# Patient Record
Sex: Male | Born: 1935 | Race: White | Marital: Married | Smoking: Former smoker
Health system: Southern US, Community
[De-identification: ages and names within clinical notes are randomized; demographics above are authoritative.]

## PROBLEM LIST (undated history)

## (undated) DIAGNOSIS — E039 Hypothyroidism, unspecified: Secondary | ICD-10-CM

## (undated) DIAGNOSIS — R972 Elevated prostate specific antigen [PSA]: Secondary | ICD-10-CM

## (undated) DIAGNOSIS — J189 Pneumonia, unspecified organism: Secondary | ICD-10-CM

## (undated) DIAGNOSIS — R430 Anosmia: Secondary | ICD-10-CM

## (undated) DIAGNOSIS — G319 Degenerative disease of nervous system, unspecified: Secondary | ICD-10-CM

## (undated) DIAGNOSIS — G629 Polyneuropathy, unspecified: Secondary | ICD-10-CM

## (undated) DIAGNOSIS — N529 Male erectile dysfunction, unspecified: Secondary | ICD-10-CM

## (undated) DIAGNOSIS — M1990 Unspecified osteoarthritis, unspecified site: Secondary | ICD-10-CM

## (undated) DIAGNOSIS — R270 Ataxia, unspecified: Secondary | ICD-10-CM

## (undated) DIAGNOSIS — H548 Legal blindness, as defined in USA: Secondary | ICD-10-CM

## (undated) DIAGNOSIS — K635 Polyp of colon: Secondary | ICD-10-CM

## (undated) DIAGNOSIS — I10 Essential (primary) hypertension: Secondary | ICD-10-CM

## (undated) DIAGNOSIS — S42309A Unspecified fracture of shaft of humerus, unspecified arm, initial encounter for closed fracture: Secondary | ICD-10-CM

## (undated) HISTORY — PX: LUMBAR LAMINECTOMY: SHX95

## (undated) HISTORY — PX: KNEE ARTHROSCOPY: SUR90

## (undated) HISTORY — PX: TONSILLECTOMY: SUR1361

## (undated) HISTORY — PX: APPENDECTOMY: SHX54

---

## 1958-08-07 DIAGNOSIS — J189 Pneumonia, unspecified organism: Secondary | ICD-10-CM

## 1958-08-07 HISTORY — DX: Pneumonia, unspecified organism: J18.9

## 1977-08-07 HISTORY — PX: OTHER SURGICAL HISTORY: SHX169

## 1997-08-07 HISTORY — PX: OTHER SURGICAL HISTORY: SHX169

## 2001-08-07 HISTORY — PX: EYE SURGERY: SHX253

## 2010-04-19 ENCOUNTER — Ambulatory Visit: Payer: Self-pay | Admitting: Gastroenterology

## 2010-04-19 HISTORY — PX: CATARACT EXTRACTION: SUR2

## 2011-04-14 ENCOUNTER — Encounter (INDEPENDENT_AMBULATORY_CARE_PROVIDER_SITE_OTHER): Payer: Medicare Other | Admitting: Ophthalmology

## 2011-04-14 DIAGNOSIS — H43819 Vitreous degeneration, unspecified eye: Secondary | ICD-10-CM

## 2011-04-14 DIAGNOSIS — H47219 Primary optic atrophy, unspecified eye: Secondary | ICD-10-CM

## 2011-04-14 DIAGNOSIS — H348192 Central retinal vein occlusion, unspecified eye, stable: Secondary | ICD-10-CM

## 2011-04-14 DIAGNOSIS — H3530 Unspecified macular degeneration: Secondary | ICD-10-CM

## 2011-07-20 ENCOUNTER — Ambulatory Visit: Payer: Self-pay

## 2012-04-17 ENCOUNTER — Encounter (INDEPENDENT_AMBULATORY_CARE_PROVIDER_SITE_OTHER): Payer: 59 | Admitting: Ophthalmology

## 2013-11-22 ENCOUNTER — Ambulatory Visit: Payer: Self-pay | Admitting: Internal Medicine

## 2014-01-17 ENCOUNTER — Emergency Department: Payer: Self-pay | Admitting: Emergency Medicine

## 2014-01-17 LAB — BASIC METABOLIC PANEL
Anion Gap: 9 (ref 7–16)
BUN: 14 mg/dL (ref 7–18)
CALCIUM: 8.9 mg/dL (ref 8.5–10.1)
CREATININE: 0.83 mg/dL (ref 0.60–1.30)
Chloride: 106 mmol/L (ref 98–107)
Co2: 25 mmol/L (ref 21–32)
EGFR (African American): >60
EGFR (Non-African Amer.): >60
GLUCOSE: 100 mg/dL — AB (ref 65–99)
Osmolality: 280 (ref 275–301)
Potassium: 4.1 mmol/L (ref 3.5–5.1)
SODIUM: 140 mmol/L (ref 136–145)

## 2014-01-17 LAB — CBC
HCT: 45.9 % (ref 40.0–52.0)
HGB: 15 g/dL (ref 13.0–18.0)
MCH: 29.5 pg (ref 26.0–34.0)
MCHC: 32.6 g/dL (ref 32.0–36.0)
MCV: 90 fL (ref 80–100)
Platelet: 211 10*3/uL (ref 150–440)
RBC: 5.08 10*6/uL (ref 4.40–5.90)
RDW: 14.6 % — ABNORMAL HIGH (ref 11.5–14.5)
WBC: 8.3 10*3/uL (ref 3.8–10.6)

## 2014-05-03 DIAGNOSIS — R430 Anosmia: Secondary | ICD-10-CM | POA: Insufficient documentation

## 2014-05-20 DIAGNOSIS — N529 Male erectile dysfunction, unspecified: Secondary | ICD-10-CM | POA: Insufficient documentation

## 2014-11-19 DIAGNOSIS — Z0000 Encounter for general adult medical examination without abnormal findings: Secondary | ICD-10-CM | POA: Insufficient documentation

## 2015-05-19 DIAGNOSIS — R972 Elevated prostate specific antigen [PSA]: Secondary | ICD-10-CM | POA: Insufficient documentation

## 2017-01-03 ENCOUNTER — Encounter: Payer: Self-pay | Admitting: *Deleted

## 2017-01-04 ENCOUNTER — Ambulatory Visit
Admission: RE | Admit: 2017-01-04 | Discharge: 2017-01-04 | Disposition: A | Discharge status: Home / Self Care | Payer: Medicare Other | Source: Ambulatory Visit | Attending: Gastroenterology | Admitting: Gastroenterology

## 2017-01-04 ENCOUNTER — Encounter
Admission: RE | Disposition: A | Discharge status: Home / Self Care | Payer: Self-pay | Source: Ambulatory Visit | Attending: Gastroenterology

## 2017-01-04 ENCOUNTER — Ambulatory Visit: Payer: Medicare Other | Admitting: Anesthesiology

## 2017-01-04 ENCOUNTER — Encounter: Payer: Self-pay | Admitting: *Deleted

## 2017-01-04 DIAGNOSIS — N528 Other male erectile dysfunction: Secondary | ICD-10-CM | POA: Insufficient documentation

## 2017-01-04 DIAGNOSIS — Z79899 Other long term (current) drug therapy: Secondary | ICD-10-CM | POA: Diagnosis not present

## 2017-01-04 DIAGNOSIS — Q438 Other specified congenital malformations of intestine: Secondary | ICD-10-CM | POA: Insufficient documentation

## 2017-01-04 DIAGNOSIS — Z86010 Personal history of colonic polyps: Secondary | ICD-10-CM | POA: Diagnosis not present

## 2017-01-04 DIAGNOSIS — Z7982 Long term (current) use of aspirin: Secondary | ICD-10-CM | POA: Insufficient documentation

## 2017-01-04 DIAGNOSIS — N400 Enlarged prostate without lower urinary tract symptoms: Secondary | ICD-10-CM | POA: Diagnosis not present

## 2017-01-04 DIAGNOSIS — E039 Hypothyroidism, unspecified: Secondary | ICD-10-CM | POA: Diagnosis not present

## 2017-01-04 DIAGNOSIS — K640 First degree hemorrhoids: Secondary | ICD-10-CM | POA: Insufficient documentation

## 2017-01-04 DIAGNOSIS — K5730 Diverticulosis of large intestine without perforation or abscess without bleeding: Secondary | ICD-10-CM | POA: Diagnosis not present

## 2017-01-04 DIAGNOSIS — I10 Essential (primary) hypertension: Secondary | ICD-10-CM | POA: Insufficient documentation

## 2017-01-04 DIAGNOSIS — D125 Benign neoplasm of sigmoid colon: Secondary | ICD-10-CM | POA: Diagnosis not present

## 2017-01-04 DIAGNOSIS — Z1211 Encounter for screening for malignant neoplasm of colon: Secondary | ICD-10-CM | POA: Diagnosis present

## 2017-01-04 HISTORY — DX: Elevated prostate specific antigen (PSA): R97.20

## 2017-01-04 HISTORY — DX: Hypothyroidism, unspecified: E03.9

## 2017-01-04 HISTORY — DX: Essential (primary) hypertension: I10

## 2017-01-04 HISTORY — DX: Polyneuropathy, unspecified: G62.9

## 2017-01-04 HISTORY — DX: Anosmia: R43.0

## 2017-01-04 HISTORY — DX: Ataxia, unspecified: R27.0

## 2017-01-04 HISTORY — PX: COLONOSCOPY WITH PROPOFOL: SHX5780

## 2017-01-04 HISTORY — DX: Unspecified osteoarthritis, unspecified site: M19.90

## 2017-01-04 HISTORY — DX: Male erectile dysfunction, unspecified: N52.9

## 2017-01-04 HISTORY — DX: Polyp of colon: K63.5

## 2017-01-04 HISTORY — DX: Pneumonia, unspecified organism: J18.9

## 2017-01-04 SURGERY — COLONOSCOPY WITH PROPOFOL
Anesthesia: General

## 2017-01-04 MED ORDER — PROPOFOL 10 MG/ML IV BOLUS
INTRAVENOUS | Status: DC | PRN
  Administered 2017-01-04: 40 mg via INTRAVENOUS

## 2017-01-04 MED ORDER — SODIUM CHLORIDE 0.9 % IV SOLN
INTRAVENOUS | Status: DC
  Administered 2017-01-04: 11:00:00 via INTRAVENOUS

## 2017-01-04 MED ORDER — PROPOFOL 500 MG/50ML IV EMUL
INTRAVENOUS | Status: AC
  Filled 2017-01-04: qty 50

## 2017-01-04 MED ORDER — PHENYLEPHRINE HCL 10 MG/ML IJ SOLN
INTRAMUSCULAR | Status: AC
  Filled 2017-01-04: qty 1

## 2017-01-04 MED ORDER — PHENYLEPHRINE HCL 10 MG/ML IJ SOLN
INTRAMUSCULAR | Status: DC | PRN
  Administered 2017-01-04 (×3): 100 ug via INTRAVENOUS

## 2017-01-04 MED ORDER — PROPOFOL 500 MG/50ML IV EMUL
INTRAVENOUS | Status: DC | PRN
  Administered 2017-01-04: 120 ug/kg/min via INTRAVENOUS

## 2017-01-04 NOTE — Op Note (Addendum)
[facility] Gastroenterology Patient Name: Robert Knight Procedure Date: 01/04/2017 12:34 PM MRN: [id number] Account #: [id number] Date of Birth: 04-28-1935 Admit Type: Outpatient Age: 82 Room: [facility] ENDO ROOM 1 Gender: Male Note Status: Finalized Procedure:            Colonoscopy Indications:          Personal history of colonic polyps Providers:            Lollie Sails, MD Referring MD:         Ocie Cornfield. Ouida Sills MD, MD (Referring MD) Medicines:            Monitored Anesthesia Care Complications:        No immediate complications. Procedure:            Pre-Anesthesia Assessment:                       - ASA Grade Assessment: II - A patient with mild                        systemic disease.                       After obtaining informed consent, the colonoscope was                        passed under direct vision. Throughout the procedure,                        the patient's blood pressure, pulse, and oxygen                        saturations were monitored continuously. The                        Colonoscope was introduced through the anus and                        advanced to the the cecum, identified by appendiceal                        orifice and ileocecal valve. The colonoscopy was                        performed with moderate difficulty due to poor bowel                        prep and a tortuous colon. Successful completion of the                        procedure was aided by lavage. The quality of the bowel                        preparation was fair. Findings:      Multiple medium-mouthed diverticula were found in the sigmoid colon,       descending colon, transverse colon and ascending colon.      Two flat polyps were found in the mid sigmoid colon. The polyps were 3       to 5 mm in size. These polyps were removed with a cold snare. Resection       and  retrieval were complete.      Non-bleeding internal hemorrhoids were found during  retroflexion and       during anoscopy. The hemorrhoids were small and Grade I (internal       hemorrhoids that do not prolapse).      No additional abnormalities were found on retroflexion.      The digital rectal exam findings include enlarged prostate. Impression:           - Preparation of the colon was fair.                       - Diverticulosis in the sigmoid colon, in the                        descending colon, in the transverse colon and in the                        ascending colon.                       - Two 3 to 5 mm polyps in the mid sigmoid colon,                        removed with a cold snare. Resected and retrieved.                       - Non-bleeding internal hemorrhoids.                       - Enlarged prostate found on digital rectal exam. Recommendation:       - Discharge patient to home.                       - Low residue diet today, then advance as tolerated to                        advance diet as tolerated. Procedure Code(s):    --- Professional ---                       [id number], Colonoscopy, flexible; with removal of tumor(s),                        polyp(s), or other lesion(s) by snare technique Diagnosis Code(s):    --- Professional ---                       K64.0, First degree hemorrhoids                       D12.5, Benign neoplasm of sigmoid colon                       Z86.010, Personal history of colonic polyps                       K57.30, Diverticulosis of large intestine without                        perforation or abscess without bleeding  N40.0, Benign prostatic hyperplasia without lower                        urinary tract symptoms CPT copyright 2016 American Medical Association. All rights reserved. The codes documented in this report are preliminary and upon coder review may  be revised to meet current compliance requirements. Lollie Sails, MD 01/04/2017 1:22:30 PM This report has been signed  electronically. Number of Addenda: 0 Note Initiated On: 01/04/2017 12:34 PM Scope Withdrawal Time: 0 hours 13 minutes 17 seconds  Total Procedure Duration: 0 hours 32 minutes 57 seconds       [facility]

## 2017-01-04 NOTE — H&P (Signed)
Outpatient short stay form Pre-procedure 01/04/2017 2:10 PM Lollie Sails MD  Primary Physician: Dr. Frazier Richards  Reason for visit:  Colonoscopy  History of present illness:  Patient is a 82 year old male presenting today as above. Has a personal history of adenomatous colon polyps. He tolerated his prep well. He takes no blood thinning agents he does take 81 mg aspirin but has held that for several days.    Current Facility-Administered Medications:  .  0.9 %  sodium chloride infusion, , Intravenous, Continuous, Lollie Sails, MD, Last Rate: 20 mL/hr at 01/04/17 1116  Prescriptions Prior to Admission  Medication Sig Dispense Refill Last Dose  . aspirin EC 81 MG tablet Take 81 mg by mouth daily.   Past Week at Unknown time  . Cholecalciferol 2000 units CAPS Take 1 capsule by mouth daily.   Past Week at Unknown time  . fluticasone (FLONASE) 50 MCG/ACT nasal spray Place 2 sprays into both nostrils daily.   Past Week at Unknown time  . levothyroxine (SYNTHROID, LEVOTHROID) 75 MCG tablet Take 75 mcg by mouth daily before breakfast. Daily on an empty stomach with a glass of water at least 30 to 60 minutes before meal   Past Week at Unknown time  . lisinopril (PRINIVIL,ZESTRIL) 10 MG tablet Take 10 mg by mouth daily.   01/03/2017 at Unknown time  . sildenafil (REVATIO) 20 MG tablet Take 20 mg by mouth daily as needed. Take 1-2 tablets by mouth once daily as needed   Not Taking at Unknown time     No Known Allergies   Past Medical History:  Diagnosis Date  . Anosmia   . Ataxia   . Colon polyp   . ED (erectile dysfunction)    of organic origin  . Elevated PSA   . Hypertension   . Hypothyroidism   . Neuropathy    peripheral neuropathy  . Osteoarthritis   . Pneumonia 1960    Review of systems:      Physical Exam    Heart and lungs: Regular rate and rhythm without rub or gallop, lungs are bilaterally clear    HEENT: Normocephalic atraumatic eyes are anicteric    Other:     Pertinant exam for procedure: Soft nontender nondistended bowel sounds positive normoactive.    Planned proceedures: Colonoscopy and indicated procedures. I have discussed the risks benefits and complications of procedures to include not limited to bleeding, infection, perforation and the risk of sedation and the patient wishes to proceed.    Lollie Sails, MD Gastroenterology 01/04/2017  2:10 PM

## 2017-01-04 NOTE — Anesthesia Postprocedure Evaluation (Signed)
Anesthesia Post Note  Patient: Robert Knight  Procedure(s) Performed: Procedure(s) (LRB): COLONOSCOPY WITH PROPOFOL (N/A)  Patient location during evaluation: Endoscopy Anesthesia Type: General Level of consciousness: awake and alert Pain management: pain level controlled Vital Signs Assessment: post-procedure vital signs reviewed and stable Respiratory status: spontaneous breathing and respiratory function stable Cardiovascular status: stable Anesthetic complications: no     Last Vitals:  Vitals:   01/04/17 1344 01/04/17 1354  BP: 114/70 137/70  Pulse: (!) 59 65  Resp: 15 12  Temp:      Last Pain:  Vitals:   01/04/17 1102  TempSrc: Tympanic                 [name],[name]

## 2017-01-04 NOTE — Anesthesia Preprocedure Evaluation (Signed)
Anesthesia Evaluation  Patient identified by MRN, date of birth, ID band  Reviewed: Allergy & Precautions, NPO status , Patient's Chart, lab work & pertinent test results  History of Anesthesia Complications (+) history of anesthetic complications  Airway Mallampati: II       Dental   Pulmonary neg pulmonary ROS,           Cardiovascular hypertension, Pt. on medications      Neuro/Psych negative neurological ROS     GI/Hepatic negative GI ROS, Neg liver ROS,   Endo/Other  Hypothyroidism   Renal/GU negative Renal ROS     Musculoskeletal   Abdominal   Peds  Hematology negative hematology ROS (+)   Anesthesia Other Findings   Reproductive/Obstetrics                             Anesthesia Physical Anesthesia Plan  ASA: II  Anesthesia Plan: General   Post-op Pain Management:    Induction: Intravenous  Airway Management Planned:   Additional Equipment:   Intra-op Plan:   Post-operative Plan:   Informed Consent: I have reviewed the patients History and Physical, chart, labs and discussed the procedure including the risks, benefits and alternatives for the proposed anesthesia with the patient or authorized representative who has indicated his/her understanding and acceptance.     Plan Discussed with:   Anesthesia Plan Comments:         Anesthesia Quick Evaluation

## 2017-01-04 NOTE — Transfer of Care (Signed)
Immediate Anesthesia Transfer of Care Note  Patient: Robert Knight  Procedure(s) Performed: Procedure(s): COLONOSCOPY WITH PROPOFOL (N/A)  Patient Location: PACU  Anesthesia Type:General  Level of Consciousness: sedated  Airway & Oxygen Therapy: Patient Spontanous Breathing and Patient connected to nasal cannula oxygen  Post-op Assessment: Report given to RN and Post -op Vital signs reviewed and stable  Post vital signs: Reviewed and stable  Last Vitals:  Vitals:   01/04/17 1102 01/04/17 1324  BP: (!) 129/91 95/56  Pulse: 68 60  Resp: 16 14  Temp: 36.4 C     Last Pain:  Vitals:   01/04/17 1102  TempSrc: Tympanic         Complications: No apparent anesthesia complications

## 2017-01-04 NOTE — Anesthesia Post-op Follow-up Note (Cosign Needed)
Anesthesia QCDR form completed.        

## 2017-01-05 ENCOUNTER — Encounter: Payer: Self-pay | Admitting: Gastroenterology

## 2017-01-05 LAB — SURGICAL PATHOLOGY

## 2017-03-26 DIAGNOSIS — M545 Low back pain, unspecified: Secondary | ICD-10-CM | POA: Insufficient documentation

## 2017-04-04 ENCOUNTER — Encounter: Payer: Self-pay | Admitting: Physical Therapy

## 2017-04-04 ENCOUNTER — Ambulatory Visit: Payer: Medicare Other | Attending: Orthopedic Surgery | Admitting: Physical Therapy

## 2017-04-04 DIAGNOSIS — M5442 Lumbago with sciatica, left side: Secondary | ICD-10-CM | POA: Diagnosis present

## 2017-04-04 DIAGNOSIS — M79605 Pain in left leg: Secondary | ICD-10-CM | POA: Diagnosis present

## 2017-04-04 NOTE — Therapy (Signed)
[place] MAIN [facility] SERVICES [address] [place], [place], [address] Phone: [phone]   Fax:  [phone]  Physical Therapy Evaluation  Patient Details  Name: Robert Knight MRN: [id number] Date of Birth: Jul 25, 1935 Referring Provider: Carlynn Spry, PA  Encounter Date: 04/04/2017      PT End of Session - 04/04/17 1124    Visit Number 1   Number of Visits 17   Date for PT Re-Evaluation 05/30/17   Authorization Type G-code 1   Authorization Time Period 10   PT Start Time 0845   PT Stop Time 0945   PT Time Calculation (min) 60 min   Activity Tolerance Patient tolerated treatment well   Behavior During Therapy [facility] for tasks assessed/performed      Past Medical History:  Diagnosis Date  . Anosmia   . Ataxia   . Colon polyp   . ED (erectile dysfunction)    of organic origin  . Elevated PSA   . Hypertension   . Hypothyroidism   . Neuropathy    peripheral neuropathy  . Osteoarthritis   . Pneumonia 1960    Past Surgical History:  Procedure Laterality Date  . APPENDECTOMY    . CATARACT EXTRACTION  04/19/2010  . COLONOSCOPY WITH PROPOFOL N/A 01/04/2017   Procedure: COLONOSCOPY WITH PROPOFOL;  Surgeon: Lollie Sails, MD;  Location: [facility] ENDOSCOPY;  Service: Endoscopy;  Laterality: N/A;  . EYE SURGERY Left 2003   vitrectomy with TPA injection  . KNEE ARTHROSCOPY Bilateral 1973, 2000, 2002   1973 left, 2000 right, 2002 right  . left hip pin Left 1999  . rectal cauterization  1979  . TONSILLECTOMY      There were no vitals filed for this visit.       Subjective Assessment - 04/04/17 0844    Subjective Pt reports he is doing a little better today than yesterday. Pt reports that his LLE will buckle intermittently with intermittent pain radiating down LLE and nearly constant mild pain in the L gluteal region. Pt has been doing some exercise on a semi-recumbent bike that he has been unable to do recently. Pt reports that  he recently started keeping his wallet in his front pocket due pain, but he previously kept wallet in his L back pocket for the past 60+ years.     Pertinent History 82 y/o male has been having radiating L sided LBP for about a week. Pt has never had pain like this before. He reports his LLE is buckling occasionally with pain down the LLE to the foot at times. Pt has been using a semi-recumbent bike for years and has had no pain with this until recently. Pt has no pain in sitting and increased pain in standing and with walking.    Limitations Lifting;Standing;Walking;House hold activities   How long can you sit comfortably? Unlimited    How long can you stand comfortably? Increased pain with initial standing and with weight shif to the LLE   How long can you walk comfortably? It varries, but limited   Diagnostic tests X-ray or lumbar spine and pelvis, no report available at this time. Patient reports that he thinks his doctor mentioned something about possible arthritis.    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   Currently in Pain? Yes   Pain Score 2    Pain Location Back  Pain Orientation Left   Pain Descriptors / Indicators Sharp;Dull  Inconsistant   Pain Type Acute pain   Pain Radiating Towards L foot   Pain Onset In the past 7 days   Pain Frequency Intermittent   Aggravating Factors  Standing, walking   Pain Relieving Factors Sitting   Effect of Pain on Daily Activities Decreased standing activity            [facility] PT Assessment - 04/04/17 0001      Assessment   Medical Diagnosis Low back pain   Referring Provider Carlynn Spry, PA   Onset Date/Surgical Date 04/11/17   Hand Dominance Right   Next MD Visit None scheduled   Prior Therapy Yes, 15 yeas ago for knees; therapy was successful     Precautions   Precautions None     Restrictions   Weight Bearing Restrictions No      Balance Screen   Has the patient fallen in the past 6 months No   Has the patient had a decrease in activity level because of a fear of falling?  No   Is the patient reluctant to leave their home because of a fear of falling?  No     Home Environment   Living Environment Private residence   Living Arrangements Spouse/significant other   Type of [place] to enter   Entrance Stairs-Number of Steps 1   Entrance Stairs-Rails None   Home Layout One level   Home Equipment None     Prior Function   Level of Independence Independent   Vocation Retired  1998   Leisure Pt spends many hours sitting to [employer]; he has been unable to keep up with this over the past week     Cognition   Overall Cognitive Status Within Functional Limits for tasks assessed         PROM/AROM:  Upper Extremity: NT  Lower extremity: Hypomobility in hip flexors, hamstrings; hips are generally hypomobile bilaterally  Lumbar  Flexion 40    Ext 6    Lateral flexion 10 degrees bilaterally   Observations:  Pt had forward head posture, rounded shoulders, and increased thoracic kyphosis.   STRENGTH:  Graded on a 0-5 scale Muscle Group Left Right  Gross Upper Extremity    Hip Flex 4+ 4+  Hip Abd 5 5  Hip Add 5 5          Knee Flex 5 5  Knee Ext 4+ 4+  Ankle DF 4 5       Sensory:  Light touch: No gross deficits with light touch; pt reports some decreased sensation at night in left ankle that is alleviated with taking sock off.   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    Coordination:   No impairments with gross assessment.       BALANCE:  Static Standing Balance  Normal Able to maintain standing balance against maximal resistance X  Good Able to maintain standing balance against moderate resistance   Good-/Fair+ Able to maintain standing balance against minimal resistance   Fair Able to stand unsupported without UE  support and without LOB for 1-2 min   Fair- Requires Min A and UE support to maintain standing without loss of balance   Poor+ Requires mod A and UE support to maintain standing without loss of balance   Poor Requires max A and UE support to  maintain standing balance without loss     Standing Dynamic Balance  Normal Stand independently unsupported, able to weight shift and cross midline maximally   Good Stand independently unsupported, able to weight shift and cross midline moderately X  Good-/Fair+ Stand independently unsupported, able to weight shift across midline minimally   Fair Stand independently unsupported, weight shift, and reach ipsilaterally, loss of balance when crossing midline   Poor+ Able to stand with Min A and reach ipsilaterally, unable to weight shift   Poor+ Able to stand with Mod A and minimally reach ipsilaterally, unable to cross midline.        SPECIAL TESTS:   FADIR (-) bilaterally   FABER (+) L with hip pain, tightness only on R  SLR (+) bilaterally with pain at below 45 degrees in gluteal region; L sided gluteal pain reproduced with ankle DF  Contralateral SLR (+) for pain in L low back with RLE leg raise  SLS (+) L with pain in gluteal region   Palpation of greater trochanter (-)   Figure Four stretch increased gluteal pain on L at end range  Slump proximal to distal (-)  Slump distal to proximal (-)       FUNCTIONAL MOBILITY:   Transfers: Pt is able to transfer sit<>stand without use of hands and no evidence of LOB.   Gait: Pt ambulates with slightly flexed posture. He is aware of this and reports it is antalgic due to pain with extension. Pt has reciprocal gait with decreased stride length when walking at preferred pace. No other gross abnormalities.     Objective measurements completed on examination: See above findings.        Treatment:  Ther-ex:   Supine:  Piriformis stretch in figure four position 2 x 30 sec with cues to avoid  painful ROM; pt reports minor discomfort with gentle stretch.  Sitting:  Hamstring stretch 2 x 30 sec with cues to hold for extended time and to maintain knee straight.   The above stretched were added to pt's HEP with instructions to perform each as above several times per day unless symptoms are worsened with stretching.           PT Education - 04/04/17 1124    Education provided Yes   Education Details plan of care, ther-ex, HEP initiated   Person(s) Educated Patient   Methods Explanation;Demonstration;Tactile cues;Verbal cues   Comprehension Verbalized understanding;Returned demonstration;Verbal cues required;Tactile cues required          PT Short Term Goals - 04/04/17 1239      PT SHORT TERM GOAL #1   Title Pt will perform HEP 3/7 days during first 4 weeks of therapy to maximize his improvement in functional mobility and safety   Baseline New HEP   Time 4   Period Weeks   Status New   Target Date 05/02/17     PT SHORT TERM GOAL #2   Title Hip flexor and hamstring AROM will improve to [facility] to enable improved posture in gait for gait efficiency.   Baseline Mildly flexed posture, limited in FABER and SLR position    Time 4   Period Weeks   Status New   Target Date 05/02/17           PT Long Term Goals - 04/04/17 1235      PT LONG TERM GOAL #1   Title Patient will report a worst pain of 3/10 on VAS in back and LEs to improve tolerance  with ADLs and reduced symptoms with activities   Baseline 8/10 at worst   Time 8   Period Weeks   Status New   Target Date 05/30/17     PT LONG TERM GOAL #2   Title Patient will tolerate standing unsupported demonstrating erect posture with minimal thoracic kyphosis for 30+ minutes with maximum of 3/10 back pain to demonstrate improved positional tolerance for standing activities and ADLs.    Baseline Immediate pain with standing, variable progression   Time 8   Period Weeks   Status New   Target Date 05/30/17     PT  LONG TERM GOAL #3   Title  Patient will demonstrate an increase in lumbar spine AROM by 10 deg in all directions to enable performance of ADLs such as overhead reaching, and lifting without limitation.    Baseline Limited in all directions: lumbar flexion 40 deg, ext 6 deg, lateral flexion 10 deg   Time 8   Period Weeks   Status New   Target Date 05/30/17     PT LONG TERM GOAL #4   Title Patient will increase lower extremity functional scale to >60/80 to demonstrate improved functional mobility and increased tolerance with ADLs.    Baseline Will assess next visit   Time 8   Period Weeks   Status New   Target Date 05/30/17     PT LONG TERM GOAL #5   Title Patient will reduce modified Oswestry score to <20 as to demonstrate minimal disability with ADLs including improved sleeping tolerance, walking/sitting tolerance etc for better mobility with ADLs.    Baseline Will assess next visit   Time 8   Period Weeks   Status New   Target Date 05/30/17                Plan - 04/04/17 1126    Clinical Impression Statement 82 y/o male presents to therapy with a new onset of left sided pain in the gluteal region that radiates down his LLE intermittently. His pain is worse in standing and in extended postures and is relieved with sitting and flexed postures. He has limited lumbar AROM in all directions with pain in extension. Strength is 4-5/5 in bilateral LEs with no gross difference left to right. Pt had positive SLR bilaterally and contralateral SLR on L indicating neural tissue involvement. FABER test was positive on the L with hip pain and hip flexor tightness. Pt had pain in left SLS, but no pain with palpation of greater trochanter or FADIR indicating low likeliness of gluteal tendinopathy. Pt likely has peripheral neural tissue irritation and will benefit from skilled therapy in order to decrease his pain and to maximize his functional mobility. Recommend skilled PT at this time.    History and Personal Factors relevant to plan of care: (-) Advanced age, radicular smptoms below the knee. (+) Independent with transportation, good PLOF, no Hx of falls   Clinical Presentation Evolving   Clinical Presentation due to: Pain with radiculopathy   Clinical Decision Making Moderate   Rehab Potential Good   Clinical Impairments Affecting Rehab Potential Pt has to begin caring for his wife after her TKA in 2 weeks.    PT Frequency 2x / week   PT Duration 8 weeks   PT Treatment/Interventions ADLs/Self Care Home Management;Biofeedback;Cryotherapy;Electrical Stimulation;Moist Heat;Gait training;Stair training;Functional mobility training;Therapeutic activities;Therapeutic exercise;Neuromuscular re-education;Balance training;Patient/family education;Manual techniques;Passive range of motion;Dry needling;Energy conservation;Visual/perceptual remediation/compensation   PT Next Visit Plan Screen hip ext in prone, sidelying hip  abd, SIJ, PSIS position; administer LEFS, and ODI; assess soft tissue in hip and glut regions.   PT Home Exercise Plan Stretching and ther-ex for hip and general LE, core stability   Consulted and Agree with Plan of Care Patient      Patient will benefit from skilled therapeutic intervention in order to improve the following deficits and impairments:  Abnormal gait, Decreased activity tolerance, Decreased mobility, Decreased range of motion, Decreased strength, Difficulty walking, Hypomobility, Increased muscle spasms, Impaired perceived functional ability, Impaired flexibility, Impaired vision/preception, Postural dysfunction, Improper body mechanics, Pain  Visit Diagnosis: Pain of left lower extremity  Acute left-sided low back pain with left-sided sciatica      G-Codes - 04-12-17 1420    Functional Assessment Tool Used (Outpatient Only) Pain, ROM, clinical judgement   Functional Limitation Mobility: Walking and moving around   Mobility: Walking and Moving  Around Current Status [phone]) At least 20 percent but less than 40 percent impaired, limited or restricted   Mobility: Walking and Moving Around Goal Status [phone]) At least 1 percent but less than 20 percent impaired, limited or restricted       Problem List There are no active problems to display for this patient.  Zonya Gudger, SPT This entire session was performed under direct supervision and direction of a licensed Chiropractor . I have personally read, edited and approve of the note as written.  Trotter,Margaret PT, DPT 04/12/17, 2:28 PM  [place] MAIN [facility] SERVICES [address] [place], [place], [address] Phone: [phone]   Fax:  [phone]  Name: DAVON ABDELAZIZ MRN: [id number] Date of Birth: 03-05-1935

## 2017-04-10 ENCOUNTER — Encounter: Payer: Private Health Insurance - Indemnity | Admitting: Physical Therapy

## 2017-04-12 ENCOUNTER — Ambulatory Visit: Payer: Medicare Other | Attending: Orthopedic Surgery

## 2017-04-12 DIAGNOSIS — M79605 Pain in left leg: Secondary | ICD-10-CM | POA: Diagnosis not present

## 2017-04-12 DIAGNOSIS — M5442 Lumbago with sciatica, left side: Secondary | ICD-10-CM

## 2017-04-12 NOTE — Therapy (Signed)
[place] MAIN [facility] SERVICES [address] [place], [place], [address] Phone: [phone]   Fax:  [phone]  Physical Therapy Treatment  Patient Details  Name: Robert Knight MRN: [id number] Date of Birth: October 07, 1934 Referring Provider: Carlynn Spry, PA  Encounter Date: 04/12/2017      PT End of Session - 04/12/17 1128    Visit Number 2   Number of Visits 17   Date for PT Re-Evaluation 05/30/17   Authorization Type G-code 2   Authorization Time Period 10   PT Start Time 0945   PT Stop Time 1029   PT Time Calculation (min) 44 min   Activity Tolerance Patient tolerated treatment well   Behavior During Therapy [facility] for tasks assessed/performed      Past Medical History:  Diagnosis Date  . Anosmia   . Ataxia   . Colon polyp   . ED (erectile dysfunction)    of organic origin  . Elevated PSA   . Hypertension   . Hypothyroidism   . Neuropathy    peripheral neuropathy  . Osteoarthritis   . Pneumonia 1960    Past Surgical History:  Procedure Laterality Date  . APPENDECTOMY    . CATARACT EXTRACTION  04/19/2010  . COLONOSCOPY WITH PROPOFOL N/A 01/04/2017   Procedure: COLONOSCOPY WITH PROPOFOL;  Surgeon: Lollie Sails, MD;  Location: [facility] ENDOSCOPY;  Service: Endoscopy;  Laterality: N/A;  . EYE SURGERY Left 2003   vitrectomy with TPA injection  . KNEE ARTHROSCOPY Bilateral 1973, 2000, 2002   1973 left, 2000 right, 2002 right  . left hip pin Left 1999  . rectal cauterization  1979  . TONSILLECTOMY      There were no vitals filed for this visit.      Subjective Assessment - 04/12/17 0947    Subjective Patient reports still having hip pain down left leg. Wants more exercises to do at home   Pertinent History 82 y/o male has been having radiating L sided LBP for about a week. Pt has never had pain like this before. He reports his LLE is buckling occasionally with pain down the LLE to the foot at times. Pt has been using a  semi-recumbent bike for years and has had no pain with this until recently. Pt has no pain in sitting and increased pain in standing and with walking.    Limitations Lifting;Standing;Walking;House hold activities   How long can you sit comfortably? Unlimited    How long can you stand comfortably? Increased pain with initial standing and with weight shif to the LLE   How long can you walk comfortably? It varries, but limited   Diagnostic tests X-ray or lumbar spine and pelvis, no report available at this time. Patient reports that he thinks his doctor mentioned something about possible arthritis.    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   Currently in Pain? Yes   Pain Score 5    Pain Location Pelvis   Pain Orientation Left   Pain Descriptors / Indicators Sharp   Pain Type Acute pain   Pain Onset In the past 7 days    L leg shorter in prone  Pain with L piriformis palpation  L SIS to mallelleli: 41, R : 43 Apparent (belly button to malleloi) L 109 R 112 MET with 5 second holds x 10  Distraction lateral and  posterior with belt Posterior rotation with MET with distraciton belt  sidleying posterior rotation with hand on ASIS and PSIS   Hip extension: -4/5 L, 4/5 R   Supine hip flexion stretch  Bridge 10x with 3 xx holds glute squeezes 10x  Posterior pelvic tilts 10x Standing hip flexor stretch againt wall 2x30 seconds   Pain after mobilization 2/10  LEFS=39/80               PT Education - 04/12/17 1127    Education provided Yes   Education Details additional HEP, anatomy of SI   Person(s) Educated Patient   Methods Explanation;Handout   Comprehension Verbalized understanding;Returned demonstration          PT Short Term Goals - 04/04/17 1239      PT SHORT TERM GOAL #1   Title Pt will perform HEP 3/7 days during first 4 weeks of therapy to maximize his improvement in  functional mobility and safety   Baseline New HEP   Time 4   Period Weeks   Status New   Target Date 05/02/17     PT SHORT TERM GOAL #2   Title Hip flexor and hamstring AROM will improve to [facility] to enable improved posture in gait for gait efficiency.   Baseline Mildly flexed posture, limited in FABER and SLR position    Time 4   Period Weeks   Status New   Target Date 05/02/17           PT Long Term Goals - 04/04/17 1235      PT LONG TERM GOAL #1   Title Patient will report a worst pain of 3/10 on VAS in back and LEs to improve tolerance with ADLs and reduced symptoms with activities   Baseline 8/10 at worst   Time 8   Period Weeks   Status New   Target Date 05/30/17     PT LONG TERM GOAL #2   Title Patient will tolerate standing unsupported demonstrating erect posture with minimal thoracic kyphosis for 30+ minutes with maximum of 3/10 back pain to demonstrate improved positional tolerance for standing activities and ADLs.    Baseline Immediate pain with standing, variable progression   Time 8   Period Weeks   Status New   Target Date 05/30/17     PT LONG TERM GOAL #3   Title  Patient will demonstrate an increase in lumbar spine AROM by 10 deg in all directions to enable performance of ADLs such as overhead reaching, and lifting without limitation.    Baseline Limited in all directions: lumbar flexion 40 deg, ext 6 deg, lateral flexion 10 deg   Time 8   Period Weeks   Status New   Target Date 05/30/17     PT LONG TERM GOAL #4   Title Patient will increase lower extremity functional scale to >60/80 to demonstrate improved functional mobility and increased tolerance with ADLs.    Baseline Will assess next visit   Time 8   Period Weeks   Status New   Target Date 05/30/17     PT LONG TERM GOAL #5   Title Patient will reduce modified Oswestry score to <20 as to demonstrate minimal disability with ADLs including improved sleeping tolerance, walking/sitting tolerance  etc for better mobility with ADLs.    Baseline Will assess next visit   Time 8   Period Weeks   Status New   Target Date 05/30/17  Plan - 04/12/17 1132    Clinical Impression Statement Patient presents leg length discrepancy, palpable anterior rotation, tight hip flexors, and weak gluteal musculature indicating L anterior rotation. Patient has tender piriformis musculature additionally. LEFS=39/80. Pain decreased after mobilizations to correct alignment. Patient given and educated on new HEP. Patient will continue to benefit from skilled physical therapy to decrease pain and maximize functional mobility.    Rehab Potential Good   Clinical Impairments Affecting Rehab Potential Pt has to begin caring for his wife after her TKA in 2 weeks.    PT Frequency 2x / week   PT Duration 8 weeks   PT Treatment/Interventions ADLs/Self Care Home Management;Biofeedback;Cryotherapy;Electrical Stimulation;Moist Heat;Gait training;Stair training;Functional mobility training;Therapeutic activities;Therapeutic exercise;Neuromuscular re-education;Balance training;Patient/family education;Manual techniques;Passive range of motion;Dry needling;Energy conservation;Visual/perceptual remediation/compensation   PT Next Visit Plan MET for anterior L hip rotation   PT Home Exercise Plan Stretching and ther-ex for hip and general LE, core stability   Consulted and Agree with Plan of Care Patient      Patient will benefit from skilled therapeutic intervention in order to improve the following deficits and impairments:  Abnormal gait, Decreased activity tolerance, Decreased mobility, Decreased range of motion, Decreased strength, Difficulty walking, Hypomobility, Increased muscle spasms, Impaired perceived functional ability, Impaired flexibility, Impaired vision/preception, Postural dysfunction, Improper body mechanics, Pain  Visit Diagnosis: Pain of left lower extremity  Acute left-sided low back  pain with left-sided sciatica     Problem List There are no active problems to display for this patient.  Janna Arch, PT, DPT   Janna Arch 04/12/2017, 11:34 AM  [place] MAIN [facility] SERVICES [address] [place], [place], [address] Phone: [phone]   Fax:  [phone]  Name: BRYCE CHEEVER MRN: [id number] Date of Birth: [birth year]

## 2017-04-16 ENCOUNTER — Ambulatory Visit: Payer: Medicare Other

## 2017-04-16 DIAGNOSIS — M79605 Pain in left leg: Secondary | ICD-10-CM

## 2017-04-16 DIAGNOSIS — M5442 Lumbago with sciatica, left side: Secondary | ICD-10-CM

## 2017-04-16 NOTE — Therapy (Signed)
[place] MAIN [facility] SERVICES [address] [place], [place], [address] Phone: [phone]   Fax:  [phone]  Physical Therapy Treatment  Patient Details  Name: Robert Knight MRN: [id number] Date of Birth: Apr 02, 1935 Referring Provider: Carlynn Spry, PA  Encounter Date: 04/16/2017      PT End of Session - 04/16/17 0856    Visit Number 3   Number of Visits 17   Date for PT Re-Evaluation 05/30/17   Authorization Type G-code 3   Authorization Time Period 10   PT Start Time 0800   PT Stop Time 0845   PT Time Calculation (min) 45 min   Activity Tolerance Patient tolerated treatment well   Behavior During Therapy [facility] for tasks assessed/performed      Past Medical History:  Diagnosis Date  . Anosmia   . Ataxia   . Colon polyp   . ED (erectile dysfunction)    of organic origin  . Elevated PSA   . Hypertension   . Hypothyroidism   . Neuropathy    peripheral neuropathy  . Osteoarthritis   . Pneumonia 1960    Past Surgical History:  Procedure Laterality Date  . APPENDECTOMY    . CATARACT EXTRACTION  04/19/2010  . COLONOSCOPY WITH PROPOFOL N/A 01/04/2017   Procedure: COLONOSCOPY WITH PROPOFOL;  Surgeon: Lollie Sails, MD;  Location: [facility] ENDOSCOPY;  Service: Endoscopy;  Laterality: N/A;  . EYE SURGERY Left 2003   vitrectomy with TPA injection  . KNEE ARTHROSCOPY Bilateral 1973, 2000, 2002   1973 left, 2000 right, 2002 right  . left hip pin Left 1999  . rectal cauterization  1979  . TONSILLECTOMY      There were no vitals filed for this visit.      Subjective Assessment - 04/16/17 0801    Subjective Patient doing HEP, having some pain continue in L hip. Taking ibuprofin at 530 this morning.    Pertinent History 82 y/o male has been having radiating L sided LBP for about a week. Pt has never had pain like this before. He reports his LLE is buckling occasionally with pain down the LLE to the foot at times. Pt has been  using a semi-recumbent bike for years and has had no pain with this until recently. Pt has no pain in sitting and increased pain in standing and with walking.    Limitations Lifting;Standing;Walking;House hold activities   How long can you sit comfortably? Unlimited    How long can you stand comfortably? Increased pain with initial standing and with weight shif to the LLE   How long can you walk comfortably? It varries, but limited   Diagnostic tests X-ray or lumbar spine and pelvis, no report available at this time. Patient reports that he thinks his doctor mentioned something about possible arthritis.    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   Currently in Pain? Yes   Pain Score 3    Pain Location Pelvis   Pain Orientation Left   Pain Descriptors / Indicators Sharp   Pain Type Acute pain   Pain Onset In the past 7 days   Pain Frequency Intermittent     L leg shorter in prone  MET with 5 second holds x 10  Distraction lateral and posterior with belt Posterior rotation with MET with distraction belt  sidelying posterior rotation with hand  on ASIS and PSIS  PROM with 30 seconds hold piriformis, distal hamstring, AB, AD, IT,  Neural glides SLR 4x5 df/pf Prone hip flexor stretch 5x60 seconds  Figure 4 stretch  Neural glides supine 90 90 Prone hip flexor stretch  Supine hip flexion stretch 1x90 seconds Bridge 10x with 3 x holds glute squeezes 10x  Posterior pelvic tilts 10x 5 second holds   Pain decreased after manual    Pt. response to medical necessity:  Patient will continue to benefit from skilled physical therapy to decrease pain and maximize functional mobility.                              PT Education - 04/16/17 0856    Education provided Yes   Education Details HEP body mechanics   Person(s) Educated Patient   Methods Explanation;Demonstration;Verbal  cues   Comprehension Verbalized understanding;Returned demonstration          PT Short Term Goals - 04/04/17 1239      PT SHORT TERM GOAL #1   Title Pt will perform HEP 3/7 days during first 4 weeks of therapy to maximize his improvement in functional mobility and safety   Baseline New HEP   Time 4   Period Weeks   Status New   Target Date 05/02/17     PT SHORT TERM GOAL #2   Title Hip flexor and hamstring AROM will improve to [facility] to enable improved posture in gait for gait efficiency.   Baseline Mildly flexed posture, limited in FABER and SLR position    Time 4   Period Weeks   Status New   Target Date 05/02/17           PT Long Term Goals - 04/04/17 1235      PT LONG TERM GOAL #1   Title Patient will report a worst pain of 3/10 on VAS in back and LEs to improve tolerance with ADLs and reduced symptoms with activities   Baseline 8/10 at worst   Time 8   Period Weeks   Status New   Target Date 05/30/17     PT LONG TERM GOAL #2   Title Patient will tolerate standing unsupported demonstrating erect posture with minimal thoracic kyphosis for 30+ minutes with maximum of 3/10 back pain to demonstrate improved positional tolerance for standing activities and ADLs.    Baseline Immediate pain with standing, variable progression   Time 8   Period Weeks   Status New   Target Date 05/30/17     PT LONG TERM GOAL #3   Title  Patient will demonstrate an increase in lumbar spine AROM by 10 deg in all directions to enable performance of ADLs such as overhead reaching, and lifting without limitation.    Baseline Limited in all directions: lumbar flexion 40 deg, ext 6 deg, lateral flexion 10 deg   Time 8   Period Weeks   Status New   Target Date 05/30/17     PT LONG TERM GOAL #4   Title Patient will increase lower extremity functional scale to >60/80 to demonstrate improved functional mobility and increased tolerance with ADLs.    Baseline Will assess next visit   Time 8    Period Weeks   Status New   Target Date 05/30/17     PT LONG TERM GOAL #5   Title Patient will reduce modified Oswestry score to <20 as to demonstrate minimal disability with  ADLs including improved sleeping tolerance, walking/sitting tolerance etc for better mobility with ADLs.    Baseline Will assess next visit   Time 8   Period Weeks   Status New   Target Date 05/30/17               Plan - 04/16/17 0859    Clinical Impression Statement Patient continues to have pain in LLE radiating to foot. Pain decreased after mobilizing L hip into more neutral alignment. Tight hip flexor musculature exacerbated pain and slow prolonged stretches decreased muscle tightness. Patient will continue to benefit from skilled physical therapy to decrease pain and maximize functional mobility.    Rehab Potential Good   Clinical Impairments Affecting Rehab Potential Pt has to begin caring for his wife after her TKA in 2 weeks.    PT Frequency 2x / week   PT Duration 8 weeks   PT Treatment/Interventions ADLs/Self Care Home Management;Biofeedback;Cryotherapy;Electrical Stimulation;Moist Heat;Gait training;Stair training;Functional mobility training;Therapeutic activities;Therapeutic exercise;Neuromuscular re-education;Balance training;Patient/family education;Manual techniques;Passive range of motion;Dry needling;Energy conservation;Visual/perceptual remediation/compensation   PT Next Visit Plan MET for anterior L hip rotation   PT Home Exercise Plan Stretching and ther-ex for hip and general LE, core stability   Consulted and Agree with Plan of Care Patient      Patient will benefit from skilled therapeutic intervention in order to improve the following deficits and impairments:  Abnormal gait, Decreased activity tolerance, Decreased mobility, Decreased range of motion, Decreased strength, Difficulty walking, Hypomobility, Increased muscle spasms, Impaired perceived functional ability, Impaired  flexibility, Impaired vision/preception, Postural dysfunction, Improper body mechanics, Pain  Visit Diagnosis: Pain of left lower extremity  Acute left-sided low back pain with left-sided sciatica     Problem List There are no active problems to display for this patient.  Janna Arch, PT, DPT    Janna Arch 04/16/2017, 9:01 AM  [place] MAIN [facility] SERVICES [address] [place], [place], [address] Phone: [phone]   Fax:  [phone]  Name: Robert Knight MRN: [id number] Date of Birth: Jan 28, 1935

## 2017-04-18 ENCOUNTER — Ambulatory Visit: Payer: Medicare Other

## 2017-04-18 DIAGNOSIS — M5442 Lumbago with sciatica, left side: Secondary | ICD-10-CM

## 2017-04-18 DIAGNOSIS — M79605 Pain in left leg: Secondary | ICD-10-CM | POA: Diagnosis not present

## 2017-04-18 NOTE — Therapy (Signed)
[place] MAIN [facility] SERVICES [address] [place], [place], [address] Phone: [phone]   Fax:  [phone]  Physical Therapy Treatment  Patient Details  Name: Robert Knight MRN: [id number] Date of Birth: [birth year] Referring Provider: Carlynn Spry, PA  Encounter Date: 04/18/2017      PT End of Session - 04/18/17 0913    Visit Number 4   Number of Visits 17   Date for PT Re-Evaluation 05/30/17   Authorization Type G-code 4   Authorization Time Period 10   PT Start Time [phone]   PT Stop Time 0930   PT Time Calculation (min) 44 min   Activity Tolerance Patient tolerated treatment well   Behavior During Therapy [facility] for tasks assessed/performed      Past Medical History:  Diagnosis Date  . Anosmia   . Ataxia   . Colon polyp   . ED (erectile dysfunction)    of organic origin  . Elevated PSA   . Hypertension   . Hypothyroidism   . Neuropathy    peripheral neuropathy  . Osteoarthritis   . Pneumonia 1960    Past Surgical History:  Procedure Laterality Date  . APPENDECTOMY    . CATARACT EXTRACTION  04/19/2010  . COLONOSCOPY WITH PROPOFOL N/A 01/04/2017   Procedure: COLONOSCOPY WITH PROPOFOL;  Surgeon: Lollie Sails, MD;  Location: [facility] ENDOSCOPY;  Service: Endoscopy;  Laterality: N/A;  . EYE SURGERY Left 2003   vitrectomy with TPA injection  . KNEE ARTHROSCOPY Bilateral 1973, 2000, 2002   1973 left, 2000 right, 2002 right  . left hip pin Left 1999  . rectal cauterization  1979  . TONSILLECTOMY      There were no vitals filed for this visit.      Subjective Assessment - 04/18/17 0850    Subjective Patient having increased pain starting yesterday, yesterday 7/10 pain,    Pertinent History 82 y/o male has been having radiating L sided LBP for about a week. Pt has never had pain like this before. He reports his LLE is buckling occasionally with pain down the LLE to the foot at times. Pt has been using a semi-recumbent  bike for years and has had no pain with this until recently. Pt has no pain in sitting and increased pain in standing and with walking.    Limitations Lifting;Standing;Walking;House hold activities   How long can you sit comfortably? Unlimited    How long can you stand comfortably? Increased pain with initial standing and with weight shif to the LLE   How long can you walk comfortably? It varries, but limited   Diagnostic tests X-ray or lumbar spine and pelvis, no report available at this time. Patient reports that he thinks his doctor mentioned something about possible arthritis.    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   Currently in Pain? Yes   Pain Score 4    Pain Location Pelvis   Pain Orientation Left   Pain Descriptors / Indicators Shooting   Pain Type Acute pain   Pain Radiating Towards L ankle   Pain Onset In the past 7 days   Pain Frequency Intermittent    Manual   L leg shorter in prone  MET with 5 second holds x 10  Distraction lateral and posterior with belt Posterior rotation with MET with distraction belt  sidelying posterior rotation  with hand on ASIS and PSIS  PROM with 60 seconds hold piriformis, distal hamstring, AB, AD, IT,  Neural glides SLR 4x5 df/pf Prone hip flexor stretch 5x60 seconds   Figure 4 stretch  Neural glides supine 90 90 Prone hip flexor stretch  Supine hip flexion stretch 1x90 seconds Standing extension kick back 10x  Supine abduction RTB 12x  Ambulating backwards in // bars 8x length of bars, cues for decreased velocity and increased step length.     Pain decreased after manual     Pt. response to medical necessity:  Patient will continue to benefit from skilled physical therapy to decrease pain and maximize functional mobility                            PT Education - 04/18/17 0913    Education provided Yes   Education  Details neural glides   Person(s) Educated Patient   Methods Explanation;Demonstration   Comprehension Verbalized understanding;Returned demonstration          PT Short Term Goals - 04/04/17 1239      PT SHORT TERM GOAL #1   Title Pt will perform HEP 3/7 days during first 4 weeks of therapy to maximize his improvement in functional mobility and safety   Baseline New HEP   Time 4   Period Weeks   Status New   Target Date 05/02/17     PT SHORT TERM GOAL #2   Title Hip flexor and hamstring AROM will improve to [facility] to enable improved posture in gait for gait efficiency.   Baseline Mildly flexed posture, limited in FABER and SLR position    Time 4   Period Weeks   Status New   Target Date 05/02/17           PT Long Term Goals - 04/04/17 1235      PT LONG TERM GOAL #1   Title Patient will report a worst pain of 3/10 on VAS in back and LEs to improve tolerance with ADLs and reduced symptoms with activities   Baseline 8/10 at worst   Time 8   Period Weeks   Status New   Target Date 05/30/17     PT LONG TERM GOAL #2   Title Patient will tolerate standing unsupported demonstrating erect posture with minimal thoracic kyphosis for 30+ minutes with maximum of 3/10 back pain to demonstrate improved positional tolerance for standing activities and ADLs.    Baseline Immediate pain with standing, variable progression   Time 8   Period Weeks   Status New   Target Date 05/30/17     PT LONG TERM GOAL #3   Title  Patient will demonstrate an increase in lumbar spine AROM by 10 deg in all directions to enable performance of ADLs such as overhead reaching, and lifting without limitation.    Baseline Limited in all directions: lumbar flexion 40 deg, ext 6 deg, lateral flexion 10 deg   Time 8   Period Weeks   Status New   Target Date 05/30/17     PT LONG TERM GOAL #4   Title Patient will increase lower extremity functional scale to >60/80 to demonstrate improved functional mobility  and increased tolerance with ADLs.    Baseline Will assess next visit   Time 8   Period Weeks   Status New   Target Date 05/30/17     PT LONG TERM GOAL #5   Title Patient  will reduce modified Oswestry score to <20 as to demonstrate minimal disability with ADLs including improved sleeping tolerance, walking/sitting tolerance etc for better mobility with ADLs.    Baseline Will assess next visit   Time 8   Period Weeks   Status New   Target Date 05/30/17               Plan - 04/18/17 1027    Clinical Impression Statement Patient leg length discrepancy decreased after manual tx. Patient improving in quality of movement of L hip and pelvis. Strengthening of LE musculature performed after re-alignment for improved muscular control in correct alignment.  Patient will continue to benefit from skilled physical therapy to decrease pain and maximize functional mobility   Rehab Potential Good   Clinical Impairments Affecting Rehab Potential Pt has to begin caring for his wife after her TKA in 2 weeks.    PT Frequency 2x / week   PT Duration 8 weeks   PT Treatment/Interventions ADLs/Self Care Home Management;Biofeedback;Cryotherapy;Electrical Stimulation;Moist Heat;Gait training;Stair training;Functional mobility training;Therapeutic activities;Therapeutic exercise;Neuromuscular re-education;Balance training;Patient/family education;Manual techniques;Passive range of motion;Dry needling;Energy conservation;Visual/perceptual remediation/compensation   PT Next Visit Plan MET for anterior L hip rotation   PT Home Exercise Plan Stretching and ther-ex for hip and general LE, core stability   Consulted and Agree with Plan of Care Patient      Patient will benefit from skilled therapeutic intervention in order to improve the following deficits and impairments:  Abnormal gait, Decreased activity tolerance, Decreased mobility, Decreased range of motion, Decreased strength, Difficulty walking,  Hypomobility, Increased muscle spasms, Impaired perceived functional ability, Impaired flexibility, Impaired vision/preception, Postural dysfunction, Improper body mechanics, Pain  Visit Diagnosis: Pain of left lower extremity  Acute left-sided low back pain with left-sided sciatica     Problem List There are no active problems to display for this patient. Janna Arch, PT, DPT    Janna Arch 04/18/2017, 10:28 AM  [place] MAIN [facility] SERVICES [address] [place], [place], [address] Phone: [phone]   Fax:  [phone]  Name: Robert Knight MRN: [id number] Date of Birth: 09/17/1934

## 2017-04-23 ENCOUNTER — Ambulatory Visit: Payer: Medicare Other

## 2017-04-23 DIAGNOSIS — M5442 Lumbago with sciatica, left side: Secondary | ICD-10-CM

## 2017-04-23 DIAGNOSIS — M79605 Pain in left leg: Secondary | ICD-10-CM | POA: Diagnosis not present

## 2017-04-23 NOTE — Therapy (Signed)
[place] MAIN [facility] SERVICES [address] [place], [place], [address] Phone: [phone]   Fax:  [phone]  Physical Therapy Treatment  Patient Details  Name: Robert Knight MRN: [id number] Date of Birth: 06-08-1935 Referring Provider: Carlynn Spry, PA  Encounter Date: 04/23/2017      PT End of Session - 04/23/17 0839    Visit Number 5   Number of Visits 17   Date for PT Re-Evaluation 05/30/17   Authorization Type G-code 5   Authorization Time Period 10   PT Start Time 0800   PT Stop Time 0846   PT Time Calculation (min) 46 min   Activity Tolerance Patient tolerated treatment well   Behavior During Therapy [facility] for tasks assessed/performed      Past Medical History:  Diagnosis Date  . Anosmia   . Ataxia   . Colon polyp   . ED (erectile dysfunction)    of organic origin  . Elevated PSA   . Hypertension   . Hypothyroidism   . Neuropathy    peripheral neuropathy  . Osteoarthritis   . Pneumonia 1960    Past Surgical History:  Procedure Laterality Date  . APPENDECTOMY    . CATARACT EXTRACTION  04/19/2010  . COLONOSCOPY WITH PROPOFOL N/A 01/04/2017   Procedure: COLONOSCOPY WITH PROPOFOL;  Surgeon: Lollie Sails, MD;  Location: [facility] ENDOSCOPY;  Service: Endoscopy;  Laterality: N/A;  . EYE SURGERY Left 2003   vitrectomy with TPA injection  . KNEE ARTHROSCOPY Bilateral 1973, 2000, 2002   1973 left, 2000 right, 2002 right  . left hip pin Left 1999  . rectal cauterization  1979  . TONSILLECTOMY      There were no vitals filed for this visit.      Subjective Assessment - 04/23/17 0802    Subjective Patient having pain shooting down L leg to ankle . compliant with HEP, not neural glides   Pertinent History 82 y/o male has been having radiating L sided LBP for about a week. Pt has never had pain like this before. He reports his LLE is buckling occasionally with pain down the LLE to the foot at times. Pt has been using a  semi-recumbent bike for years and has had no pain with this until recently. Pt has no pain in sitting and increased pain in standing and with walking.    Limitations Lifting;Standing;Walking;House hold activities   How long can you sit comfortably? Unlimited    How long can you stand comfortably? Increased pain with initial standing and with weight shif to the LLE   How long can you walk comfortably? It varries, but limited   Diagnostic tests X-ray or lumbar spine and pelvis, no report available at this time. Patient reports that he thinks his doctor mentioned something about possible arthritis.    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   Currently in Pain? Yes   Pain Location Pelvis   Pain Orientation Left   Pain Descriptors / Indicators Shooting   Pain Type Acute pain;Neuropathic pain   Pain Onset In the past 7 days   Pain Frequency Intermittent     Manual  L leg shorter in prone  MET with 5 second holds x 10  Distraction lateral and posterior with belt Posterior rotation with MET with distraction belt  sidelying posterior rotation with hand on ASIS and PSIS  PROM with 60 seconds hold piriformis, distal hamstring, AB, AD, IT,  Neural glides SLR 4x5 df/pf Prone hip flexor stretch 5x60 seconds  Figure 4 stretch  Neural glides supine 90 90 Prone hip flexor stretch Supine hip flexion stretch 1x90 seconds Standing extension kick back 10x  Step stretch in // bars 2x60 seconds    Pain decreased after manual   Pt. response to medical necessity: Patient will continue to benefit from skilled physical therapy to decrease pain and maximize functional mobility                             PT Education - 04/23/17 0848    Education provided Yes   Education Details neural glides   Person(s) Educated Patient   Methods Explanation;Demonstration;Handout   Comprehension  Verbalized understanding;Returned demonstration          PT Short Term Goals - 04/04/17 1239      PT SHORT TERM GOAL #1   Title Pt will perform HEP 3/7 days during first 4 weeks of therapy to maximize his improvement in functional mobility and safety   Baseline New HEP   Time 4   Period Weeks   Status New   Target Date 05/02/17     PT SHORT TERM GOAL #2   Title Hip flexor and hamstring AROM will improve to [facility] to enable improved posture in gait for gait efficiency.   Baseline Mildly flexed posture, limited in FABER and SLR position    Time 4   Period Weeks   Status New   Target Date 05/02/17           PT Long Term Goals - 04/04/17 1235      PT LONG TERM GOAL #1   Title Patient will report a worst pain of 3/10 on VAS in back and LEs to improve tolerance with ADLs and reduced symptoms with activities   Baseline 8/10 at worst   Time 8   Period Weeks   Status New   Target Date 05/30/17     PT LONG TERM GOAL #2   Title Patient will tolerate standing unsupported demonstrating erect posture with minimal thoracic kyphosis for 30+ minutes with maximum of 3/10 back pain to demonstrate improved positional tolerance for standing activities and ADLs.    Baseline Immediate pain with standing, variable progression   Time 8   Period Weeks   Status New   Target Date 05/30/17     PT LONG TERM GOAL #3   Title  Patient will demonstrate an increase in lumbar spine AROM by 10 deg in all directions to enable performance of ADLs such as overhead reaching, and lifting without limitation.    Baseline Limited in all directions: lumbar flexion 40 deg, ext 6 deg, lateral flexion 10 deg   Time 8   Period Weeks   Status New   Target Date 05/30/17     PT LONG TERM GOAL #4   Title Patient will increase lower extremity functional scale to >60/80 to demonstrate improved functional mobility and increased tolerance with ADLs.    Baseline Will assess next visit   Time 8   Period Weeks   Status  New   Target Date 05/30/17     PT LONG TERM GOAL #5   Title Patient will reduce modified Oswestry score to <20 as to demonstrate minimal disability with ADLs including improved sleeping tolerance, walking/sitting tolerance etc for better mobility with ADLs.  Baseline Will assess next visit   Time 8   Period Weeks   Status New   Target Date 05/30/17               Plan - 04/23/17 0855    Clinical Impression Statement Patient's pain decreased to 2/10 after manual based interventions. Increased musculoskeletal tightness noted bilaterally with improvement upon prolonged hold stretch. Patient will continue to benefit from skilled physical therapy to decrease pain and maximize functional mobility   Rehab Potential Good   Clinical Impairments Affecting Rehab Potential Pt has to begin caring for his wife after her TKA in 2 weeks.    PT Frequency 2x / week   PT Duration 8 weeks   PT Treatment/Interventions ADLs/Self Care Home Management;Biofeedback;Cryotherapy;Electrical Stimulation;Moist Heat;Gait training;Stair training;Functional mobility training;Therapeutic activities;Therapeutic exercise;Neuromuscular re-education;Balance training;Patient/family education;Manual techniques;Passive range of motion;Dry needling;Energy conservation;Visual/perceptual remediation/compensation   PT Next Visit Plan MET for anterior L hip rotation   PT Home Exercise Plan Stretching and ther-ex for hip and general LE, core stability   Consulted and Agree with Plan of Care Patient      Patient will benefit from skilled therapeutic intervention in order to improve the following deficits and impairments:  Abnormal gait, Decreased activity tolerance, Decreased mobility, Decreased range of motion, Decreased strength, Difficulty walking, Hypomobility, Increased muscle spasms, Impaired perceived functional ability, Impaired flexibility, Impaired vision/preception, Postural dysfunction, Improper body mechanics,  Pain  Visit Diagnosis: Pain of left lower extremity  Acute left-sided low back pain with left-sided sciatica     Problem List There are no active problems to display for this patient.  Janna Arch, PT, DPT   Janna Arch 04/23/2017, 8:57 AM  [place] MAIN [facility] SERVICES [address] [place], [place], [address] Phone: [phone]   Fax:  [phone]  Name: RYLER LASKOWSKI MRN: [id number] Date of Birth: [birth year]

## 2017-04-25 ENCOUNTER — Ambulatory Visit: Payer: Medicare Other

## 2017-04-25 DIAGNOSIS — M79605 Pain in left leg: Secondary | ICD-10-CM

## 2017-04-25 DIAGNOSIS — M5442 Lumbago with sciatica, left side: Secondary | ICD-10-CM

## 2017-04-25 NOTE — Therapy (Signed)
[place] MAIN [facility] SERVICES [address] [place], [place], [address] Phone: [phone]   Fax:  [phone]  Physical Therapy Treatment  Patient Details  Name: Robert Knight MRN: [id number] Date of Birth: 15-Feb-1935 Referring Provider: Carlynn Spry, PA  Encounter Date: 04/25/2017      PT End of Session - 04/25/17 0753    Visit Number 6   Number of Visits 17   Date for PT Re-Evaluation 05/30/17   Authorization Type G-code 6   Authorization Time Period 10   PT Start Time 0759   PT Stop Time 0845   PT Time Calculation (min) 46 min   Activity Tolerance Patient tolerated treatment well   Behavior During Therapy [facility] for tasks assessed/performed      Past Medical History:  Diagnosis Date  . Anosmia   . Ataxia   . Colon polyp   . ED (erectile dysfunction)    of organic origin  . Elevated PSA   . Hypertension   . Hypothyroidism   . Neuropathy    peripheral neuropathy  . Osteoarthritis   . Pneumonia 1960    Past Surgical History:  Procedure Laterality Date  . APPENDECTOMY    . CATARACT EXTRACTION  04/19/2010  . COLONOSCOPY WITH PROPOFOL N/A 01/04/2017   Procedure: COLONOSCOPY WITH PROPOFOL;  Surgeon: Lollie Sails, MD;  Location: [facility] ENDOSCOPY;  Service: Endoscopy;  Laterality: N/A;  . EYE SURGERY Left 2003   vitrectomy with TPA injection  . KNEE ARTHROSCOPY Bilateral 1973, 2000, 2002   1973 left, 2000 right, 2002 right  . left hip pin Left 1999  . rectal cauterization  1979  . TONSILLECTOMY      There were no vitals filed for this visit.      Subjective Assessment - 04/25/17 0803    Subjective Patient had increased pain from walking in the hospital, wife had surgery yesterday.    Pertinent History 82 y/o male has been having radiating L sided LBP for about a week. Pt has never had pain like this before. He reports his LLE is buckling occasionally with pain down the LLE to the foot at times. Pt has been using a  semi-recumbent bike for years and has had no pain with this until recently. Pt has no pain in sitting and increased pain in standing and with walking.    Limitations Lifting;Standing;Walking;House hold activities   How long can you sit comfortably? Unlimited    How long can you stand comfortably? Increased pain with initial standing and with weight shif to the LLE   How long can you walk comfortably? It varries, but limited   Diagnostic tests X-ray or lumbar spine and pelvis, no report available at this time. Patient reports that he thinks his doctor mentioned something about possible arthritis.    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   Currently in Pain? Yes   Pain Score 3    Pain Location Pelvis   Pain Orientation Left   Pain Descriptors / Indicators Shooting   Pain Type Acute pain;Neuropathic pain   Pain Onset In the past 7 days      Manual   L leg shorter in prone  MET with 5 second holds x 10  Distraction lateral and posterior with belt Posterior rotation with MET with distraction belt  sidelying posterior rotation with hand on ASIS and  PSIS  PROM with 60 seconds hold piriformis, distal hamstring, AB, AD, IT,  Neural glides SLR 4x5 df/pf Prone hip flexor stretch 5x60 seconds   Figure 4 stretch  Neural glides supine 90 90 Prone hip flexor stretch  Supine hip flexion stretch 1x90 seconds Standing extension kick back 10x  Step stretch in // bars 2x60 seconds   TA Contraction 10x 5 second holds  TA contraction with swiss ball 10x 5 second holds    Pain decreased after manual     Pt. response to medical necessity:  Patient will continue to benefit from skilled physical therapy to decrease pain and maximize functional mobility                             PT Education - 04/25/17 0752    Education provided Yes   Education Details stretching at home, MET    Person(s) Educated Patient   Methods Explanation;Demonstration;Verbal cues   Comprehension Verbalized understanding;Returned demonstration          PT Short Term Goals - 04/04/17 1239      PT SHORT TERM GOAL #1   Title Pt will perform HEP 3/7 days during first 4 weeks of therapy to maximize his improvement in functional mobility and safety   Baseline New HEP   Time 4   Period Weeks   Status New   Target Date 05/02/17     PT SHORT TERM GOAL #2   Title Hip flexor and hamstring AROM will improve to [facility] to enable improved posture in gait for gait efficiency.   Baseline Mildly flexed posture, limited in FABER and SLR position    Time 4   Period Weeks   Status New   Target Date 05/02/17           PT Long Term Goals - 04/04/17 1235      PT LONG TERM GOAL #1   Title Patient will report a worst pain of 3/10 on VAS in back and LEs to improve tolerance with ADLs and reduced symptoms with activities   Baseline 8/10 at worst   Time 8   Period Weeks   Status New   Target Date 05/30/17     PT LONG TERM GOAL #2   Title Patient will tolerate standing unsupported demonstrating erect posture with minimal thoracic kyphosis for 30+ minutes with maximum of 3/10 back pain to demonstrate improved positional tolerance for standing activities and ADLs.    Baseline Immediate pain with standing, variable progression   Time 8   Period Weeks   Status New   Target Date 05/30/17     PT LONG TERM GOAL #3   Title  Patient will demonstrate an increase in lumbar spine AROM by 10 deg in all directions to enable performance of ADLs such as overhead reaching, and lifting without limitation.    Baseline Limited in all directions: lumbar flexion 40 deg, ext 6 deg, lateral flexion 10 deg   Time 8   Period Weeks   Status New   Target Date 05/30/17     PT LONG TERM GOAL #4   Title Patient will increase lower extremity functional scale to >60/80 to demonstrate improved functional mobility and increased  tolerance with ADLs.    Baseline Will assess next visit   Time 8   Period Weeks   Status New   Target Date 05/30/17     PT LONG TERM GOAL #5   Title  Patient will reduce modified Oswestry score to <20 as to demonstrate minimal disability with ADLs including improved sleeping tolerance, walking/sitting tolerance etc for better mobility with ADLs.    Baseline Will assess next visit   Time 8   Period Weeks   Status New   Target Date 05/30/17               Plan - 04/25/17 0845    Clinical Impression Statement Pt. Demonstrated carryover of neurological glides from previous session. Decreased need for verbal cueing for proper body mechanics for neural glides. Distraction relieving of pain. Decreased discrepancy of leg length after manual. Strengthening performed after correction of bny alignment.   Patient will continue to benefit from skilled physical therapy to decrease pain and maximize functional mobility   Rehab Potential Good   Clinical Impairments Affecting Rehab Potential Pt has to begin caring for his wife after her TKA in 2 weeks.    PT Frequency 2x / week   PT Duration 8 weeks   PT Treatment/Interventions ADLs/Self Care Home Management;Biofeedback;Cryotherapy;Electrical Stimulation;Moist Heat;Gait training;Stair training;Functional mobility training;Therapeutic activities;Therapeutic exercise;Neuromuscular re-education;Balance training;Patient/family education;Manual techniques;Passive range of motion;Dry needling;Energy conservation;Visual/perceptual remediation/compensation   PT Next Visit Plan MET for anterior L hip rotation   PT Home Exercise Plan Stretching and ther-ex for hip and general LE, core stability   Consulted and Agree with Plan of Care Patient      Patient will benefit from skilled therapeutic intervention in order to improve the following deficits and impairments:  Abnormal gait, Decreased activity tolerance, Decreased mobility, Decreased range of motion,  Decreased strength, Difficulty walking, Hypomobility, Increased muscle spasms, Impaired perceived functional ability, Impaired flexibility, Impaired vision/preception, Postural dysfunction, Improper body mechanics, Pain  Visit Diagnosis: Pain of left lower extremity  Acute left-sided low back pain with left-sided sciatica     Problem List There are no active problems to display for this patient. Janna Arch, PT, DPT    Janna Arch 04/25/2017, 8:45 AM  [place] MAIN [facility] SERVICES [address] [place], [place], [address] Phone: [phone]   Fax:  [phone]  Name: AMARE BAIL MRN: [id number] Date of Birth: 30-May-1935

## 2017-05-01 ENCOUNTER — Ambulatory Visit: Payer: Medicare Other

## 2017-05-01 DIAGNOSIS — M79605 Pain in left leg: Secondary | ICD-10-CM | POA: Diagnosis not present

## 2017-05-01 DIAGNOSIS — M5442 Lumbago with sciatica, left side: Secondary | ICD-10-CM

## 2017-05-01 NOTE — Therapy (Signed)
[place] MAIN [facility] SERVICES [address] [place], [place], [address] Phone: [phone]   Fax:  [phone]  Physical Therapy Treatment  Patient Details  Name: Robert Knight MRN: [id number] Date of Birth: 1935-04-15 Referring Provider: Carlynn Spry, PA  Encounter Date: 05/01/2017      PT End of Session - 05/01/17 0753    Visit Number 7   Number of Visits 17   Date for PT Re-Evaluation 05/30/17   Authorization Type G-code 7   Authorization Time Period 10   PT Start Time 0800   PT Stop Time 0845   PT Time Calculation (min) 45 min   Activity Tolerance Patient tolerated treatment well   Behavior During Therapy [facility] for tasks assessed/performed      Past Medical History:  Diagnosis Date  . Anosmia   . Ataxia   . Colon polyp   . ED (erectile dysfunction)    of organic origin  . Elevated PSA   . Hypertension   . Hypothyroidism   . Neuropathy    peripheral neuropathy  . Osteoarthritis   . Pneumonia 1960    Past Surgical History:  Procedure Laterality Date  . APPENDECTOMY    . CATARACT EXTRACTION  04/19/2010  . COLONOSCOPY WITH PROPOFOL N/A 01/04/2017   Procedure: COLONOSCOPY WITH PROPOFOL;  Surgeon: Lollie Sails, MD;  Location: [facility] ENDOSCOPY;  Service: Endoscopy;  Laterality: N/A;  . EYE SURGERY Left 2003   vitrectomy with TPA injection  . KNEE ARTHROSCOPY Bilateral 1973, 2000, 2002   1973 left, 2000 right, 2002 right  . left hip pin Left 1999  . rectal cauterization  1979  . TONSILLECTOMY      There were no vitals filed for this visit.      Subjective Assessment - 05/01/17 0753    Subjective Pt reports he is sore this morning. He reports approximately 2 hours of minimal relief following therapy sessions followed by return of symptoms. Pain is extending all the way down to his ankle today. No specific questions or concerns at this time.    Pertinent History 82 y/o male has been having radiating L sided LBP for  about a week. Pt has never had pain like this before. He reports his LLE is buckling occasionally with pain down the LLE to the foot at times. Pt has been using a semi-recumbent bike for years and has had no pain with this until recently. Pt has no pain in sitting and increased pain in standing and with walking.    Limitations Lifting;Standing;Walking;House hold activities   How long can you sit comfortably? Unlimited    How long can you stand comfortably? Increased pain with initial standing and with weight shif to the LLE   How long can you walk comfortably? It varries, but limited   Diagnostic tests X-ray or lumbar spine and pelvis, no report available at this time. Patient reports that he thinks his doctor mentioned something about possible arthritis.    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   Currently in Pain? Yes   Pain Score 4    Pain Location Hip   Pain Orientation Left   Pain Descriptors / Indicators Shooting   Pain Type Acute pain;Neuropathic pain   Pain Radiating Towards L ankle   Pain Onset 1 to 4 weeks ago   Pain Frequency Intermittent  TREATMENT  Manual  Supine L HS stretch 30s hold x 3, pt with notably shortened HS, difficulty fully extending knee; L hip supine piriformis stretch 30s hold x 3; L hip inferior distraction mobilization with belt grade III, 30s/bout x 2 bouts; L hip medial to lateral mobilization grade III, 30s/bout x 2 bouts L hip inferior mobilizations at 90 degrees hip flexion, 30s/bouts x 2 bouts; Posterior rotation with MET with distraction belt 5s hold x 5;  R sidelying L hip STM to glut max, med, and deep external rotators, pt reports increase in symptomatic pain during STM but no reproduction of radiating pain; Prone lumbar CPA and UPA assessment with notable hypomobility but no reproduction of symptoms; Prone STM to L hip external rotators; Prone L  quad stretch 30s hold x 2; Prone L hip flexor stretch 30s hold 2; Prone L hip IR stretch 30s hold x 3; Prone L hip PA grade III mobilizations, 30s/bouts x 3 bouts;  Pain decreased after manual therapy  Pt. response to medical necessity:  Patient will continue to benefit from skilled physical therapy to decrease pain and maximize functional mobility                           PT Education - 05/01/17 0753    Education provided Yes   Education Details plan of care   Person(s) Educated Patient   Methods Explanation   Comprehension Verbalized understanding          PT Short Term Goals - 04/04/17 1239      PT SHORT TERM GOAL #1   Title Pt will perform HEP 3/7 days during first 4 weeks of therapy to maximize his improvement in functional mobility and safety   Baseline New HEP   Time 4   Period Weeks   Status New   Target Date 05/02/17     PT SHORT TERM GOAL #2   Title Hip flexor and hamstring AROM will improve to [facility] to enable improved posture in gait for gait efficiency.   Baseline Mildly flexed posture, limited in FABER and SLR position    Time 4   Period Weeks   Status New   Target Date 05/02/17           PT Long Term Goals - 04/04/17 1235      PT LONG TERM GOAL #1   Title Patient will report a worst pain of 3/10 on VAS in back and LEs to improve tolerance with ADLs and reduced symptoms with activities   Baseline 8/10 at worst   Time 8   Period Weeks   Status New   Target Date 05/30/17     PT LONG TERM GOAL #2   Title Patient will tolerate standing unsupported demonstrating erect posture with minimal thoracic kyphosis for 30+ minutes with maximum of 3/10 back pain to demonstrate improved positional tolerance for standing activities and ADLs.    Baseline Immediate pain with standing, variable progression   Time 8   Period Weeks   Status New   Target Date 05/30/17     PT LONG TERM GOAL #3   Title  Patient will demonstrate an increase in  lumbar spine AROM by 10 deg in all directions to enable performance of ADLs such as overhead reaching, and lifting without limitation.    Baseline Limited in all directions: lumbar flexion 40 deg, ext 6 deg, lateral flexion 10 deg   Time 8   Period Weeks  Status New   Target Date 05/30/17     PT LONG TERM GOAL #4   Title Patient will increase lower extremity functional scale to >60/80 to demonstrate improved functional mobility and increased tolerance with ADLs.    Baseline Will assess next visit   Time 8   Period Weeks   Status New   Target Date 05/30/17     PT LONG TERM GOAL #5   Title Patient will reduce modified Oswestry score to <20 as to demonstrate minimal disability with ADLs including improved sleeping tolerance, walking/sitting tolerance etc for better mobility with ADLs.    Baseline Will assess next visit   Time 8   Period Weeks   Status New   Target Date 05/30/17               Plan - 05/01/17 0754    Clinical Impression Statement Unable to reproduce patient's LLE symptoms today except when in standing positions. Lumbar CPA and L UPA are all hypomobile but no positive reproduction of patient's symptoms. Pt reports pain relief when forward flexing spine in standing as well as sitting down. He is very tender to palpation along posterior and posterolateral musculature of L hip especially deep external rotators. He has very limited L hip internal rotation as well as limited hip extension, HS length, and quad length. Pt reporting minimal relief from therapy and no change in symptoms since start of therapy. Discussed differential for patient's symptoms as well as need to return to MD if no sustained relief in the coming weeks. Pt reports significant pain relief at the end of the session following manual therapy. Pt encouraged to continue HEP and follow-up as scheduled.    Clinical Presentation Evolving   Clinical Decision Making Moderate   Rehab Potential Good   Clinical  Impairments Affecting Rehab Potential Pt has to begin caring for his wife after her TKA in 2 weeks.    PT Frequency 2x / week   PT Duration 8 weeks   PT Treatment/Interventions ADLs/Self Care Home Management;Biofeedback;Cryotherapy;Electrical Stimulation;Moist Heat;Gait training;Stair training;Functional mobility training;Therapeutic activities;Therapeutic exercise;Neuromuscular re-education;Balance training;Patient/family education;Manual techniques;Passive range of motion;Dry needling;Energy conservation;Visual/perceptual remediation/compensation   PT Next Visit Plan MET for anterior L hip rotation, assess symptoms response from STM and stretching of external rotators.    PT Home Exercise Plan Stretching and ther-ex for hip and general LE, core stability   Consulted and Agree with Plan of Care Patient      Patient will benefit from skilled therapeutic intervention in order to improve the following deficits and impairments:  Abnormal gait, Decreased activity tolerance, Decreased mobility, Decreased range of motion, Decreased strength, Difficulty walking, Hypomobility, Increased muscle spasms, Impaired perceived functional ability, Impaired flexibility, Impaired vision/preception, Postural dysfunction, Improper body mechanics, Pain  Visit Diagnosis: Pain of left lower extremity  Acute left-sided low back pain with left-sided sciatica     Problem List There are no active problems to display for this patient.  Phillips Grout PT, DPT   Jonisha Kindig 05/01/2017, 11:50 AM  [place] MAIN [facility] SERVICES [address] [place], [place], [address] Phone: [phone]   Fax:  [phone]  Name: KENDALE REMBOLD MRN: [id number] Date of Birth: 15-May-1935

## 2017-05-03 ENCOUNTER — Ambulatory Visit: Payer: Medicare Other

## 2017-05-03 DIAGNOSIS — M79605 Pain in left leg: Secondary | ICD-10-CM

## 2017-05-03 DIAGNOSIS — M5442 Lumbago with sciatica, left side: Secondary | ICD-10-CM

## 2017-05-03 NOTE — Therapy (Signed)
[place] MAIN [facility] SERVICES [address] [place], [place], [address] Phone: [phone]   Fax:  [phone]  Physical Therapy Treatment  Patient Details  Name: Robert Knight MRN: [id number] Date of Birth: February 02, 1935 Referring Provider: Carlynn Spry, PA  Encounter Date: 05/03/2017      PT End of Session - 05/03/17 0804    Visit Number 8   Number of Visits 17   Date for PT Re-Evaluation 05/30/17   Authorization Type G-code 8   Authorization Time Period 10   PT Start Time 0800   PT Stop Time 0845   PT Time Calculation (min) 45 min   Activity Tolerance Patient tolerated treatment well   Behavior During Therapy [facility] for tasks assessed/performed      Past Medical History:  Diagnosis Date  . Anosmia   . Ataxia   . Colon polyp   . ED (erectile dysfunction)    of organic origin  . Elevated PSA   . Hypertension   . Hypothyroidism   . Neuropathy    peripheral neuropathy  . Osteoarthritis   . Pneumonia 1960    Past Surgical History:  Procedure Laterality Date  . APPENDECTOMY    . CATARACT EXTRACTION  04/19/2010  . COLONOSCOPY WITH PROPOFOL N/A 01/04/2017   Procedure: COLONOSCOPY WITH PROPOFOL;  Surgeon: Lollie Sails, MD;  Location: [facility] ENDOSCOPY;  Service: Endoscopy;  Laterality: N/A;  . EYE SURGERY Left 2003   vitrectomy with TPA injection  . KNEE ARTHROSCOPY Bilateral 1973, 2000, 2002   1973 left, 2000 right, 2002 right  . left hip pin Left 1999  . rectal cauterization  1979  . TONSILLECTOMY      There were no vitals filed for this visit.      Subjective Assessment - 05/03/17 0803    Subjective Patient reports feeling very stiff after last session. Still taking advil to relieve pain between symptoms.    Pertinent History 82 y/o male has been having radiating L sided LBP for about a week. Pt has never had pain like this before. He reports his LLE is buckling occasionally with pain down the LLE to the foot at times.  Pt has been using a semi-recumbent bike for years and has had no pain with this until recently. Pt has no pain in sitting and increased pain in standing and with walking.    Limitations Lifting;Standing;Walking;House hold activities   How long can you sit comfortably? Unlimited    How long can you stand comfortably? Increased pain with initial standing and with weight shif to the LLE   How long can you walk comfortably? It varries, but limited   Diagnostic tests X-ray or lumbar spine and pelvis, no report available at this time. Patient reports that he thinks his doctor mentioned something about possible arthritis.    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   Currently in Pain? Yes   Pain Score 4    Pain Location Hip   Pain Orientation Left   Pain Descriptors / Indicators Shooting   Pain Type Acute pain;Neuropathic pain   Pain Onset 1 to 4 weeks ago   Pain Frequency Intermittent      Recheck goals:  VAS 4/10, worst 7/10 Lumbar spine AROM: flexion limited by tight hamstrings,  Left rotation increased pain through L hip .  LEFS= 52/80 MODI=22%  TREATMENT Manual  Supine L HS stretch 30s hold x 3, pt with notably shortened HS, difficulty fully extending knee; L hip supine piriformis stretch 30s hold x 3; L hip inferior distraction mobilization with belt grade III, 30s/bout x 2 bouts; L hip medial to lateral mobilization grade III, 30s/bout x 2 bouts L hip inferior mobilizations at 90 degrees hip flexion, 30s/bouts x 2 bouts; Posterior rotation with MET with distraction belt 5s hold x 5;  Posterior rotation MET with PVC pipe : press down with L hamstring and push up with R. 10x 5 seconds  R sidelying L hip STM to glut max, med, and deep external rotators, pt reports increase in symptomatic pain during STM but no reproduction of radiating pain; Prone lumbar CPA and UPA assessment with notable  hypomobility but no reproduction of symptoms; Prone STM to L hip external rotators;    Pain decreased after manual therapy                            PT Education - 05/03/17 0758    Education provided Yes   Education Details POC/discharge, return to doctor   Person(s) Educated Patient   Methods Explanation;Demonstration;Verbal cues   Comprehension Verbalized understanding;Returned demonstration          PT Short Term Goals - 05/03/17 0805      PT SHORT TERM GOAL #1   Title Pt will perform HEP 3/7 days during first 4 weeks of therapy to maximize his improvement in functional mobility and safety   Baseline perform everyday   Time 4   Period Weeks   Status Achieved     PT SHORT TERM GOAL #2   Title Hip flexor and hamstring AROM will improve to [facility] to enable improved posture in gait for gait efficiency.   Baseline Mildly flexed posture, limited in FABER and SLR position    Time 4   Period Weeks   Status Partially Met           PT Long Term Goals - 05/03/17 0806      PT LONG TERM GOAL #1   Title Patient will report a worst pain of 3/10 on VAS in back and LEs to improve tolerance with ADLs and reduced symptoms with activities   Baseline 9/27: 6-7/10; 8/10 at worst   Time 8   Period Weeks   Status Not Met     PT LONG TERM GOAL #2   Title Patient will tolerate standing unsupported demonstrating erect posture with minimal thoracic kyphosis for 30+ minutes with maximum of 3/10 back pain to demonstrate improved positional tolerance for standing activities and ADLs.    Baseline pain with prolonged standing   Time 8   Period Weeks   Status Not Met     PT LONG TERM GOAL #3   Title  Patient will demonstrate an increase in lumbar spine AROM by 10 deg in all directions to enable performance of ADLs such as overhead reaching, and lifting without limitation.    Baseline Limited in all directions: hamstring mobility limiting flexion; extension limited by  pain,    Time 8   Period Weeks   Status Partially Met     PT LONG TERM GOAL #4   Title Patient will increase lower extremity functional scale to >60/80 to demonstrate improved functional mobility and increased tolerance with ADLs.    Baseline 52/80   Time 8   Period Weeks   Status Partially Met  PT LONG TERM GOAL #5   Title Patient will reduce modified Oswestry score to <20 as to demonstrate minimal disability with ADLs including improved sleeping tolerance, walking/sitting tolerance etc for better mobility with ADLs.    Baseline 22%   Time 8   Period Weeks   Status Partially Met               Plan - 05/03/17 1243    Clinical Impression Statement Patient having limited carryover of improvement of symptoms between sessions. Relief of symptoms las tshort term with limited/no carryover. MODI=22%, LEFS=52/80;  VAS=4/10 with worst a 7/10. Recommending patient return to MD due to limited relief of symptoms. Discharging patient at this time, will be happy to see patient again in future.    Rehab Potential Good   Clinical Impairments Affecting Rehab Potential Pt has to begin caring for his wife after her TKA in 2 weeks.    PT Frequency 2x / week   PT Duration 8 weeks   PT Treatment/Interventions ADLs/Self Care Home Management;Biofeedback;Cryotherapy;Electrical Stimulation;Moist Heat;Gait training;Stair training;Functional mobility training;Therapeutic activities;Therapeutic exercise;Neuromuscular re-education;Balance training;Patient/family education;Manual techniques;Passive range of motion;Dry needling;Energy conservation;Visual/perceptual remediation/compensation   PT Home Exercise Plan Stretching and ther-ex for hip and general LE, core stability   Consulted and Agree with Plan of Care Patient      Patient will benefit from skilled therapeutic intervention in order to improve the following deficits and impairments:  Abnormal gait, Decreased activity tolerance, Decreased  mobility, Decreased range of motion, Decreased strength, Difficulty walking, Hypomobility, Increased muscle spasms, Impaired perceived functional ability, Impaired flexibility, Impaired vision/preception, Postural dysfunction, Improper body mechanics, Pain  Visit Diagnosis: Pain of left lower extremity  Acute left-sided low back pain with left-sided sciatica     Problem List There are no active problems to display for this patient.  Janna Arch, PT, DPT   Janna Arch 05/03/2017, 12:45 PM  [place] MAIN [facility] SERVICES [address] [place], [place], [address] Phone: [phone]   Fax:  [phone]  Name: Robert Knight MRN: [id number] Date of Birth: 1934-10-22

## 2017-05-07 ENCOUNTER — Ambulatory Visit: Payer: Medicare Other

## 2017-05-09 ENCOUNTER — Ambulatory Visit: Payer: Medicare Other

## 2017-05-14 ENCOUNTER — Ambulatory Visit: Payer: Medicare Other

## 2017-05-15 ENCOUNTER — Other Ambulatory Visit: Payer: Self-pay | Admitting: Orthopedic Surgery

## 2017-05-15 DIAGNOSIS — M5440 Lumbago with sciatica, unspecified side: Secondary | ICD-10-CM

## 2017-05-15 DIAGNOSIS — M545 Low back pain, unspecified: Secondary | ICD-10-CM

## 2017-05-15 DIAGNOSIS — M5136 Other intervertebral disc degeneration, lumbar region: Secondary | ICD-10-CM

## 2017-05-16 ENCOUNTER — Ambulatory Visit: Payer: Medicare Other

## 2017-05-21 ENCOUNTER — Ambulatory Visit
Admission: RE | Admit: 2017-05-21 | Discharge: 2017-05-21 | Disposition: A | Discharge status: Home / Self Care | Payer: Medicare Other | Source: Ambulatory Visit | Attending: Orthopedic Surgery | Admitting: Orthopedic Surgery

## 2017-05-21 DIAGNOSIS — M48061 Spinal stenosis, lumbar region without neurogenic claudication: Secondary | ICD-10-CM | POA: Diagnosis not present

## 2017-05-21 DIAGNOSIS — M5440 Lumbago with sciatica, unspecified side: Secondary | ICD-10-CM | POA: Diagnosis not present

## 2017-05-21 DIAGNOSIS — M545 Low back pain, unspecified: Secondary | ICD-10-CM

## 2017-05-21 DIAGNOSIS — M4807 Spinal stenosis, lumbosacral region: Secondary | ICD-10-CM | POA: Insufficient documentation

## 2017-05-21 DIAGNOSIS — M5136 Other intervertebral disc degeneration, lumbar region: Secondary | ICD-10-CM | POA: Insufficient documentation

## 2017-05-21 DIAGNOSIS — M7138 Other bursal cyst, other site: Secondary | ICD-10-CM | POA: Insufficient documentation

## 2017-06-06 ENCOUNTER — Other Ambulatory Visit: Payer: Self-pay | Admitting: Orthopedic Surgery

## 2017-06-19 NOTE — Pre-Procedure Instructions (Signed)
Robert Knight  06/19/2017      [id number] [name], [place] [address] Phone: [phone] Fax: [phone]    Your procedure is scheduled on Wednesday, June 27, 2017  Report to [facility] Admitting Entrance "A" at 6:30A.M.   Call this number if you have problems the morning of surgery:  [phone]   Remember:  Do not eat food or drink liquids after midnight.  Take these medicines the morning of surgery with A SIP OF WATER: Gabapentin (NEURONTIN), Levothyroxine (SYNTHROID, LEVOTHROID, and Fluticasone (FLONASE).  Follow your doctor's instruction regarding Aspirin.  As of today, stop taking all Aspirin products, Vitamins, Fish oils, and Herbal medications. Also stop all NSAIDS i.e. Advil, Ibuprofen, Motrin, Aleve, Anaprox, Naproxen, BC and Goody Powders.   Do not wear jewelry.  Do not wear lotions, powders, colognes, or deodorant.  Do not shave 48 hours prior to surgery.  Men may shave face and neck.  Do not bring valuables to the hospital.  [facility] is not responsible for any belongings or valuables.  Contacts, dentures or bridgework may not be worn into surgery.  Leave your suitcase in the car.  After surgery it may be brought to your room.  For patients admitted to the hospital, discharge time will be determined by your treatment team.  Patients discharged the day of surgery will not be allowed to drive home.   Special instructions:   [facility]- Preparing For Surgery  Before surgery, you can play an important role. Because skin is not sterile, your skin needs to be as free of germs as possible. You can reduce the number of germs on your skin by washing with CHG (chlorahexidine gluconate) Soap before surgery.  CHG is an antiseptic cleaner which kills germs and bonds with the skin to continue killing germs even after washing.  Please do not use if you have an allergy to CHG or antibacterial  soaps. If your skin becomes reddened/irritated stop using the CHG.  Do not shave (including legs and underarms) for at least 48 hours prior to first CHG shower. It is OK to shave your face.  Please follow these instructions carefully.   1. Shower the NIGHT BEFORE SURGERY and the MORNING OF SURGERY with CHG.   2. If you chose to wash your hair, wash your hair first as usual with your normal shampoo.  3. After you shampoo, rinse your hair and body thoroughly to remove the shampoo.  4. Use CHG as you would any other liquid soap. You can apply CHG directly to the skin and wash gently with a scrungie or a clean washcloth.   5. Apply the CHG Soap to your body ONLY FROM THE NECK DOWN.  Do not use on open wounds or open sores. Avoid contact with your eyes, ears, mouth and genitals (private parts). Wash Face and genitals (private parts)  with your normal soap.  6. Wash thoroughly, paying special attention to the area where your surgery will be performed.  7. Thoroughly rinse your body with warm water from the neck down.  8. DO NOT shower/wash with your normal soap after using and rinsing off the CHG Soap.  9. Pat yourself dry with a CLEAN TOWEL.  10. Wear CLEAN PAJAMAS to bed the night before surgery, wear comfortable clothes the morning of surgery  11. Place CLEAN SHEETS on your bed the night of your first shower and DO NOT SLEEP WITH PETS.  Day of Surgery: Do not apply any deodorants/lotions. Please wear clean clothes to the hospital/surgery center.    Please read over the following fact sheets that you were given. Pain Booklet, Coughing and Deep Breathing, MRSA Information and Surgical Site Infection Prevention

## 2017-06-20 ENCOUNTER — Other Ambulatory Visit: Payer: Self-pay

## 2017-06-20 ENCOUNTER — Encounter (HOSPITAL_COMMUNITY)
Admission: RE | Admit: 2017-06-20 | Discharge: 2017-06-20 | Disposition: A | Discharge status: Home / Self Care | Payer: Medicare Other | Source: Ambulatory Visit | Attending: Orthopedic Surgery | Admitting: Orthopedic Surgery

## 2017-06-20 ENCOUNTER — Encounter (HOSPITAL_COMMUNITY): Payer: Self-pay

## 2017-06-20 DIAGNOSIS — R9431 Abnormal electrocardiogram [ECG] [EKG]: Secondary | ICD-10-CM | POA: Diagnosis not present

## 2017-06-20 DIAGNOSIS — H5440 Blindness, one eye, unspecified eye: Secondary | ICD-10-CM | POA: Diagnosis not present

## 2017-06-20 DIAGNOSIS — E039 Hypothyroidism, unspecified: Secondary | ICD-10-CM | POA: Diagnosis not present

## 2017-06-20 DIAGNOSIS — I4510 Unspecified right bundle-branch block: Secondary | ICD-10-CM | POA: Diagnosis not present

## 2017-06-20 DIAGNOSIS — Z01818 Encounter for other preprocedural examination: Secondary | ICD-10-CM | POA: Insufficient documentation

## 2017-06-20 DIAGNOSIS — Z7982 Long term (current) use of aspirin: Secondary | ICD-10-CM | POA: Diagnosis not present

## 2017-06-20 DIAGNOSIS — Z01812 Encounter for preprocedural laboratory examination: Secondary | ICD-10-CM | POA: Diagnosis not present

## 2017-06-20 DIAGNOSIS — Z87891 Personal history of nicotine dependence: Secondary | ICD-10-CM | POA: Diagnosis not present

## 2017-06-20 DIAGNOSIS — M79605 Pain in left leg: Secondary | ICD-10-CM | POA: Insufficient documentation

## 2017-06-20 DIAGNOSIS — Z79899 Other long term (current) drug therapy: Secondary | ICD-10-CM | POA: Insufficient documentation

## 2017-06-20 DIAGNOSIS — Z0183 Encounter for blood typing: Secondary | ICD-10-CM | POA: Diagnosis not present

## 2017-06-20 DIAGNOSIS — I10 Essential (primary) hypertension: Secondary | ICD-10-CM | POA: Diagnosis not present

## 2017-06-20 HISTORY — DX: Legal blindness, as defined in USA: H54.8

## 2017-06-20 HISTORY — DX: Unspecified fracture of shaft of humerus, unspecified arm, initial encounter for closed fracture: S42.309A

## 2017-06-20 LAB — URINALYSIS, ROUTINE W REFLEX MICROSCOPIC
BILIRUBIN URINE: NEGATIVE
Glucose, UA: NEGATIVE mg/dL
HGB URINE DIPSTICK: NEGATIVE
Ketones, ur: NEGATIVE mg/dL
Leukocytes, UA: NEGATIVE
Nitrite: NEGATIVE
PROTEIN: NEGATIVE mg/dL
Specific Gravity, Urine: 1.006 (ref 1.005–1.030)
pH: 6 (ref 5.0–8.0)

## 2017-06-20 LAB — CBC WITH DIFFERENTIAL/PLATELET
Basophils Absolute: 0 10*3/uL (ref 0.0–0.1)
Basophils Relative: 0 %
EOS PCT: 2 %
Eosinophils Absolute: 0.1 10*3/uL (ref 0.0–0.7)
HEMATOCRIT: 45.1 % (ref 39.0–52.0)
Hemoglobin: 15.1 g/dL (ref 13.0–17.0)
LYMPHS PCT: 18 %
Lymphs Abs: 1 10*3/uL (ref 0.7–4.0)
MCH: 30.9 pg (ref 26.0–34.0)
MCHC: 33.5 g/dL (ref 30.0–36.0)
MCV: 92.2 fL (ref 78.0–100.0)
MONO ABS: 0.4 10*3/uL (ref 0.1–1.0)
MONOS PCT: 8 %
NEUTROS ABS: 3.7 10*3/uL (ref 1.7–7.7)
Neutrophils Relative %: 72 %
PLATELETS: 204 10*3/uL (ref 150–400)
RBC: 4.89 MIL/uL (ref 4.22–5.81)
RDW: 13.9 % (ref 11.5–15.5)
WBC: 5.2 10*3/uL (ref 4.0–10.5)

## 2017-06-20 LAB — COMPREHENSIVE METABOLIC PANEL
ALK PHOS: 72 U/L (ref 38–126)
ALT: 20 U/L (ref 17–63)
ANION GAP: 6 (ref 5–15)
AST: 22 U/L (ref 15–41)
Albumin: 3.9 g/dL (ref 3.5–5.0)
BILIRUBIN TOTAL: 0.9 mg/dL (ref 0.3–1.2)
BUN: 14 mg/dL (ref 6–20)
CALCIUM: 9.2 mg/dL (ref 8.9–10.3)
CO2: 29 mmol/L (ref 22–32)
Chloride: 106 mmol/L (ref 101–111)
Creatinine, Ser: 0.92 mg/dL (ref 0.61–1.24)
Glucose, Bld: 112 mg/dL — ABNORMAL HIGH (ref 65–99)
Potassium: 3.9 mmol/L (ref 3.5–5.1)
Sodium: 141 mmol/L (ref 135–145)
TOTAL PROTEIN: 6.4 g/dL — AB (ref 6.5–8.1)

## 2017-06-20 LAB — SURGICAL PCR SCREEN
MRSA, PCR: NEGATIVE
STAPHYLOCOCCUS AUREUS: NEGATIVE

## 2017-06-20 LAB — TYPE AND SCREEN
ABO/RH(D): O POS
Antibody Screen: NEGATIVE

## 2017-06-20 LAB — PROTIME-INR
INR: 1.04
PROTHROMBIN TIME: 13.5 s (ref 11.4–15.2)

## 2017-06-20 LAB — ABO/RH: ABO/RH(D): O POS

## 2017-06-20 LAB — APTT: APTT: 35 s (ref 24–36)

## 2017-06-20 NOTE — Progress Notes (Signed)
PCP: Dr. Frazier Richards @ [facility] in [facility]  Cardiologist: none  Pt. Stated last dose aspirin was 04/19/17. He was instructed by Dr. Lynann Bologna

## 2017-06-21 NOTE — Progress Notes (Signed)
Anesthesia Chart Review:  Pt is a 82 year old male scheduled for left sided L3-4-5 transforaminal lumbar interbody fusion on 06/27/2017 with Phylliss Bob, M.D.  - PCP is Frazier Richards, MD who cleared pt for surgery at last office visit 06/18/17 noting "Ok to proceed directly to surgery given 10 miles on the exercise bike he can ride. Only increased risk is age." (notes in care everywhere)   PMH includes:  HTN, hypothyroidism. Legally blind in left eye. Former smoker. BMI 20.  Medications include: ASA 81 mg, levothyroxine, lisinopril. Last dose ASA 06/19/17  BP (!) 154/81   Pulse 80   Temp 36.6 C   Resp 20   Ht 6\' 5"  (1.956 m)   Wt 169 lb 6.4 oz (76.8 kg)   SpO2 99%   BMI 20.09 kg/m    Preoperative labs reviewed.    EKG 06/20/17: NSR. RBBB.Poss lateral MI. Possible Inferior infarct, age undetermined  If no changes, I anticipate pt can proceed with surgery as scheduled.   Willeen Cass, FNP-BC [facility]/Anesthesiology Phone: [phone] 06/21/2017 6:00 PM

## 2017-06-26 NOTE — Anesthesia Preprocedure Evaluation (Signed)
Anesthesia Evaluation  Patient identified by MRN, date of birth, ID band Patient awake    Reviewed: Allergy & Precautions, H&P , Patient's Chart, lab work & pertinent test results, reviewed documented beta blocker date and time   Airway Mallampati: II  TM Distance: >3 FB Neck ROM: full    Dental no notable dental hx.    Pulmonary former smoker,    Pulmonary exam normal breath sounds clear to auscultation       Cardiovascular hypertension,  Rhythm:regular Rate:Normal     Neuro/Psych    GI/Hepatic   Endo/Other    Renal/GU      Musculoskeletal   Abdominal   Peds  Hematology   Anesthesia Other Findings PCP is Frazier Richards, MD who cleared pt for surgery at last office visit 06/18/17 noting "Ok to proceed directly to surgery given 10 miles on the exercise bike he can ride. Only increased risk is age."   Reproductive/Obstetrics                             Anesthesia Physical Anesthesia Plan  ASA: II  Anesthesia Plan: General   Post-op Pain Management:    Induction: Intravenous  PONV Risk Score and Plan: 1 and Treatment may vary due to age or medical condition, Dexamethasone and Ondansetron  Airway Management Planned: Oral ETT  Additional Equipment:   Intra-op Plan:   Post-operative Plan: Extubation in OR  Informed Consent: I have reviewed the patients History and Physical, chart, labs and discussed the procedure including the risks, benefits and alternatives for the proposed anesthesia with the patient or authorized representative who has indicated his/her understanding and acceptance.   Dental Advisory Given  Plan Discussed with: CRNA and Surgeon  Anesthesia Plan Comments: (  )        Anesthesia Quick Evaluation

## 2017-06-27 ENCOUNTER — Inpatient Hospital Stay (HOSPITAL_COMMUNITY)
Admission: RE | Admit: 2017-06-27 | Discharge: 2017-06-28 | DRG: 455 | Disposition: A | Discharge status: Home / Self Care | Payer: Medicare Other | Source: Ambulatory Visit | Attending: Orthopedic Surgery | Admitting: Orthopedic Surgery

## 2017-06-27 ENCOUNTER — Inpatient Hospital Stay (HOSPITAL_COMMUNITY): Payer: Medicare Other | Admitting: Anesthesiology

## 2017-06-27 ENCOUNTER — Inpatient Hospital Stay (HOSPITAL_COMMUNITY): Payer: Medicare Other | Admitting: Emergency Medicine

## 2017-06-27 ENCOUNTER — Other Ambulatory Visit: Payer: Self-pay

## 2017-06-27 ENCOUNTER — Inpatient Hospital Stay (HOSPITAL_COMMUNITY): Payer: Medicare Other

## 2017-06-27 ENCOUNTER — Inpatient Hospital Stay (HOSPITAL_COMMUNITY)
Admission: RE | Disposition: A | Discharge status: Home / Self Care | Payer: Self-pay | Source: Ambulatory Visit | Attending: Orthopedic Surgery

## 2017-06-27 ENCOUNTER — Encounter (HOSPITAL_COMMUNITY): Payer: Self-pay

## 2017-06-27 DIAGNOSIS — H548 Legal blindness, as defined in USA: Secondary | ICD-10-CM | POA: Diagnosis present

## 2017-06-27 DIAGNOSIS — M4316 Spondylolisthesis, lumbar region: Secondary | ICD-10-CM | POA: Diagnosis present

## 2017-06-27 DIAGNOSIS — Z7982 Long term (current) use of aspirin: Secondary | ICD-10-CM | POA: Diagnosis not present

## 2017-06-27 DIAGNOSIS — Z86010 Personal history of colonic polyps: Secondary | ICD-10-CM

## 2017-06-27 DIAGNOSIS — Z419 Encounter for procedure for purposes other than remedying health state, unspecified: Secondary | ICD-10-CM

## 2017-06-27 DIAGNOSIS — Z79890 Hormone replacement therapy (postmenopausal): Secondary | ICD-10-CM | POA: Diagnosis not present

## 2017-06-27 DIAGNOSIS — M48061 Spinal stenosis, lumbar region without neurogenic claudication: Secondary | ICD-10-CM | POA: Diagnosis present

## 2017-06-27 DIAGNOSIS — M5416 Radiculopathy, lumbar region: Principal | ICD-10-CM | POA: Diagnosis present

## 2017-06-27 DIAGNOSIS — Z9849 Cataract extraction status, unspecified eye: Secondary | ICD-10-CM | POA: Diagnosis not present

## 2017-06-27 DIAGNOSIS — Z87891 Personal history of nicotine dependence: Secondary | ICD-10-CM

## 2017-06-27 DIAGNOSIS — M79605 Pain in left leg: Secondary | ICD-10-CM | POA: Diagnosis present

## 2017-06-27 DIAGNOSIS — M5410 Radiculopathy, site unspecified: Secondary | ICD-10-CM | POA: Diagnosis present

## 2017-06-27 DIAGNOSIS — Z7951 Long term (current) use of inhaled steroids: Secondary | ICD-10-CM

## 2017-06-27 DIAGNOSIS — G629 Polyneuropathy, unspecified: Secondary | ICD-10-CM | POA: Diagnosis present

## 2017-06-27 DIAGNOSIS — E039 Hypothyroidism, unspecified: Secondary | ICD-10-CM | POA: Diagnosis present

## 2017-06-27 SURGERY — POSTERIOR LUMBAR FUSION 1 LEVEL
Anesthesia: General | Laterality: Left

## 2017-06-27 MED ORDER — OXYCODONE-ACETAMINOPHEN 5-325 MG PO TABS
ORAL_TABLET | ORAL | Status: AC
  Filled 2017-06-27: qty 1

## 2017-06-27 MED ORDER — ONDANSETRON HCL 4 MG/2ML IJ SOLN
INTRAMUSCULAR | Status: AC
  Filled 2017-06-27: qty 2

## 2017-06-27 MED ORDER — THROMBIN 20000 UNITS EX KIT
PACK | CUTANEOUS | Status: AC
  Filled 2017-06-27: qty 1

## 2017-06-27 MED ORDER — THROMBIN (RECOMBINANT) 20000 UNITS EX SOLR
CUTANEOUS | Status: DC | PRN
  Administered 2017-06-27: 20000 [IU] via TOPICAL

## 2017-06-27 MED ORDER — PHENYLEPHRINE 40 MCG/ML (10ML) SYRINGE FOR IV PUSH (FOR BLOOD PRESSURE SUPPORT)
PREFILLED_SYRINGE | INTRAVENOUS | Status: AC
  Filled 2017-06-27: qty 10

## 2017-06-27 MED ORDER — MEPERIDINE HCL 25 MG/ML IJ SOLN: 6.2500 mg | INTRAMUSCULAR | Status: DC | PRN

## 2017-06-27 MED ORDER — MIDAZOLAM HCL 2 MG/2ML IJ SOLN
INTRAMUSCULAR | Status: AC
  Filled 2017-06-27: qty 2

## 2017-06-27 MED ORDER — ROCURONIUM BROMIDE 100 MG/10ML IV SOLN
INTRAVENOUS | Status: DC | PRN
  Administered 2017-06-27: 30 mg via INTRAVENOUS
  Administered 2017-06-27: 20 mg via INTRAVENOUS
  Administered 2017-06-27: 50 mg via INTRAVENOUS

## 2017-06-27 MED ORDER — DIAZEPAM 5 MG PO TABS
5.0000 mg | ORAL_TABLET | Freq: Four times a day (QID) | ORAL | Status: DC | PRN
  Administered 2017-06-27: 5 mg via ORAL
  Filled 2017-06-27: qty 1

## 2017-06-27 MED ORDER — PHENYLEPHRINE HCL 10 MG/ML IJ SOLN
INTRAVENOUS | Status: DC | PRN
  Administered 2017-06-27: 20 ug/min via INTRAVENOUS

## 2017-06-27 MED ORDER — SODIUM CHLORIDE 0.9% FLUSH
3.0000 mL | Freq: Two times a day (BID) | INTRAVENOUS | Status: DC
  Administered 2017-06-27: 3 mL via INTRAVENOUS

## 2017-06-27 MED ORDER — PROPOFOL 10 MG/ML IV BOLUS
INTRAVENOUS | Status: DC | PRN
  Administered 2017-06-27: 100 mg via INTRAVENOUS
  Administered 2017-06-27: 50 mg via INTRAVENOUS

## 2017-06-27 MED ORDER — EPHEDRINE 5 MG/ML INJ
INTRAVENOUS | Status: AC
  Filled 2017-06-27: qty 10

## 2017-06-27 MED ORDER — FENTANYL CITRATE (PF) 100 MCG/2ML IJ SOLN
INTRAMUSCULAR | Status: DC | PRN
  Administered 2017-06-27 (×5): 50 ug via INTRAVENOUS

## 2017-06-27 MED ORDER — ACETAMINOPHEN 325 MG PO TABS: 650.0000 mg | ORAL_TABLET | ORAL | Status: DC | PRN

## 2017-06-27 MED ORDER — LEVOTHYROXINE SODIUM 75 MCG PO TABS
75.0000 ug | ORAL_TABLET | Freq: Every day | ORAL | Status: DC
  Administered 2017-06-28: 75 ug via ORAL
  Filled 2017-06-27: qty 1

## 2017-06-27 MED ORDER — PROPOFOL 500 MG/50ML IV EMUL
INTRAVENOUS | Status: AC
  Filled 2017-06-27: qty 150

## 2017-06-27 MED ORDER — FLUTICASONE PROPIONATE 50 MCG/ACT NA SUSP
2.0000 | Freq: Every day | NASAL | Status: DC
  Filled 2017-06-27: qty 16

## 2017-06-27 MED ORDER — METHYLENE BLUE 0.5 % INJ SOLN
INTRAVENOUS | Status: DC | PRN
  Administered 2017-06-27: .2 mL via INTRADERMAL

## 2017-06-27 MED ORDER — ROCURONIUM BROMIDE 10 MG/ML (PF) SYRINGE
PREFILLED_SYRINGE | INTRAVENOUS | Status: AC
  Filled 2017-06-27: qty 5

## 2017-06-27 MED ORDER — PANTOPRAZOLE SODIUM 40 MG IV SOLR: 40.0000 mg | Freq: Every day | INTRAVENOUS | Status: DC

## 2017-06-27 MED ORDER — SUGAMMADEX SODIUM 500 MG/5ML IV SOLN
INTRAVENOUS | Status: DC | PRN
  Administered 2017-06-27: 306.8 mg via INTRAVENOUS

## 2017-06-27 MED ORDER — GABAPENTIN 100 MG PO CAPS
100.0000 mg | ORAL_CAPSULE | Freq: Two times a day (BID) | ORAL | Status: DC
  Administered 2017-06-27: 100 mg via ORAL
  Filled 2017-06-27: qty 1

## 2017-06-27 MED ORDER — FENTANYL CITRATE (PF) 100 MCG/2ML IJ SOLN: 25.0000 ug | INTRAMUSCULAR | Status: DC | PRN

## 2017-06-27 MED ORDER — MENTHOL 3 MG MT LOZG: 1.0000 | LOZENGE | OROMUCOSAL | Status: DC | PRN

## 2017-06-27 MED ORDER — SENNOSIDES-DOCUSATE SODIUM 8.6-50 MG PO TABS: 1.0000 | ORAL_TABLET | Freq: Every evening | ORAL | Status: DC | PRN

## 2017-06-27 MED ORDER — ACETAMINOPHEN 650 MG RE SUPP: 650.0000 mg | RECTAL | Status: DC | PRN

## 2017-06-27 MED ORDER — EPHEDRINE SULFATE 50 MG/ML IJ SOLN
INTRAMUSCULAR | Status: DC | PRN
  Administered 2017-06-27: 10 mg via INTRAVENOUS

## 2017-06-27 MED ORDER — LISINOPRIL 20 MG PO TABS: 10.0000 mg | ORAL_TABLET | Freq: Every day | ORAL | Status: DC

## 2017-06-27 MED ORDER — MORPHINE SULFATE (PF) 4 MG/ML IV SOLN: 1.0000 mg | INTRAVENOUS | Status: DC | PRN

## 2017-06-27 MED ORDER — PROPOFOL 500 MG/50ML IV EMUL
INTRAVENOUS | Status: DC | PRN
  Administered 2017-06-27: 50 ug/kg/min via INTRAVENOUS

## 2017-06-27 MED ORDER — FLEET ENEMA 7-19 GM/118ML RE ENEM: 1.0000 | ENEMA | Freq: Once | RECTAL | Status: DC | PRN

## 2017-06-27 MED ORDER — OXYCODONE-ACETAMINOPHEN 5-325 MG PO TABS
1.0000 | ORAL_TABLET | ORAL | Status: DC | PRN
  Administered 2017-06-27 – 2017-06-28 (×4): 1 via ORAL
  Filled 2017-06-27 (×3): qty 1

## 2017-06-27 MED ORDER — SODIUM CHLORIDE 0.9% FLUSH: 3.0000 mL | INTRAVENOUS | Status: DC | PRN

## 2017-06-27 MED ORDER — ONDANSETRON HCL 4 MG/2ML IJ SOLN: 4.0000 mg | Freq: Once | INTRAMUSCULAR | Status: DC | PRN

## 2017-06-27 MED ORDER — CEFAZOLIN SODIUM-DEXTROSE 2-3 GM-%(50ML) IV SOLR
INTRAVENOUS | Status: DC | PRN
  Administered 2017-06-27: 2 g via INTRAVENOUS

## 2017-06-27 MED ORDER — BUPIVACAINE-EPINEPHRINE (PF) 0.25% -1:200000 IJ SOLN
INTRAMUSCULAR | Status: AC
  Filled 2017-06-27: qty 30

## 2017-06-27 MED ORDER — ZOLPIDEM TARTRATE 5 MG PO TABS: 5.0000 mg | ORAL_TABLET | Freq: Every evening | ORAL | Status: DC | PRN

## 2017-06-27 MED ORDER — HEMOSTATIC AGENTS (NO CHARGE) OPTIME
TOPICAL | Status: DC | PRN
  Administered 2017-06-27: 1 via TOPICAL

## 2017-06-27 MED ORDER — ALUM & MAG HYDROXIDE-SIMETH 200-200-20 MG/5ML PO SUSP
30.0000 mL | Freq: Four times a day (QID) | ORAL | Status: DC | PRN
  Filled 2017-06-27: qty 30

## 2017-06-27 MED ORDER — BUPIVACAINE LIPOSOME 1.3 % IJ SUSP
20.0000 mL | INTRAMUSCULAR | Status: DC
  Filled 2017-06-27: qty 20

## 2017-06-27 MED ORDER — LIDOCAINE 2% (20 MG/ML) 5 ML SYRINGE
INTRAMUSCULAR | Status: AC
  Filled 2017-06-27: qty 5

## 2017-06-27 MED ORDER — PROPOFOL 1000 MG/100ML IV EMUL
INTRAVENOUS | Status: AC
  Filled 2017-06-27: qty 300

## 2017-06-27 MED ORDER — DOCUSATE SODIUM 100 MG PO CAPS
100.0000 mg | ORAL_CAPSULE | Freq: Two times a day (BID) | ORAL | Status: DC
  Administered 2017-06-27: 100 mg via ORAL
  Filled 2017-06-27: qty 1

## 2017-06-27 MED ORDER — LACTATED RINGERS IV SOLN
INTRAVENOUS | Status: DC | PRN
  Administered 2017-06-27 (×2): via INTRAVENOUS

## 2017-06-27 MED ORDER — CEFAZOLIN SODIUM-DEXTROSE 2-4 GM/100ML-% IV SOLN
INTRAVENOUS | Status: AC
  Filled 2017-06-27: qty 100

## 2017-06-27 MED ORDER — CEFAZOLIN SODIUM-DEXTROSE 2-4 GM/100ML-% IV SOLN
2.0000 g | Freq: Three times a day (TID) | INTRAVENOUS | Status: AC
  Administered 2017-06-27 – 2017-06-28 (×2): 2 g via INTRAVENOUS
  Filled 2017-06-27 (×2): qty 100

## 2017-06-27 MED ORDER — MIDAZOLAM HCL 5 MG/5ML IJ SOLN
INTRAMUSCULAR | Status: DC | PRN
  Administered 2017-06-27: 1 mg via INTRAVENOUS

## 2017-06-27 MED ORDER — PHENOL 1.4 % MT LIQD: 1.0000 | OROMUCOSAL | Status: DC | PRN

## 2017-06-27 MED ORDER — ACETAMINOPHEN 500 MG PO TABS
1000.0000 mg | ORAL_TABLET | ORAL | Status: DC | PRN
  Administered 2017-06-27 – 2017-06-28 (×2): 500 mg via ORAL
  Filled 2017-06-27 (×2): qty 2

## 2017-06-27 MED ORDER — ONDANSETRON HCL 4 MG PO TABS: 4.0000 mg | ORAL_TABLET | Freq: Four times a day (QID) | ORAL | Status: DC | PRN

## 2017-06-27 MED ORDER — LIDOCAINE HCL (CARDIAC) 20 MG/ML IV SOLN
INTRAVENOUS | Status: DC | PRN
  Administered 2017-06-27: 100 mg via INTRAVENOUS

## 2017-06-27 MED ORDER — 0.9 % SODIUM CHLORIDE (POUR BTL) OPTIME
TOPICAL | Status: DC | PRN
  Administered 2017-06-27: 1000 mL

## 2017-06-27 MED ORDER — METHYLENE BLUE 0.5 % INJ SOLN
INTRAVENOUS | Status: AC
  Filled 2017-06-27: qty 10

## 2017-06-27 MED ORDER — PROPOFOL 10 MG/ML IV BOLUS
INTRAVENOUS | Status: AC
  Filled 2017-06-27: qty 40

## 2017-06-27 MED ORDER — ONDANSETRON HCL 4 MG/2ML IJ SOLN: 4.0000 mg | Freq: Four times a day (QID) | INTRAMUSCULAR | Status: DC | PRN

## 2017-06-27 MED ORDER — ONDANSETRON HCL 4 MG/2ML IJ SOLN
INTRAMUSCULAR | Status: DC | PRN
  Administered 2017-06-27: 4 mg via INTRAVENOUS

## 2017-06-27 MED ORDER — FENTANYL CITRATE (PF) 250 MCG/5ML IJ SOLN
INTRAMUSCULAR | Status: AC
  Filled 2017-06-27: qty 5

## 2017-06-27 MED ORDER — PANTOPRAZOLE SODIUM 40 MG PO TBEC
40.0000 mg | DELAYED_RELEASE_TABLET | Freq: Every day | ORAL | Status: DC
  Administered 2017-06-27: 40 mg via ORAL
  Filled 2017-06-27: qty 1

## 2017-06-27 MED ORDER — BISACODYL 5 MG PO TBEC: 5.0000 mg | DELAYED_RELEASE_TABLET | Freq: Every day | ORAL | Status: DC | PRN

## 2017-06-27 MED ORDER — SUGAMMADEX SODIUM 500 MG/5ML IV SOLN
INTRAVENOUS | Status: AC
  Filled 2017-06-27: qty 5

## 2017-06-27 MED ORDER — POTASSIUM CHLORIDE IN NACL 20-0.9 MEQ/L-% IV SOLN: INTRAVENOUS | Status: DC

## 2017-06-27 MED ORDER — ALBUMIN HUMAN 5 % IV SOLN
INTRAVENOUS | Status: DC | PRN
  Administered 2017-06-27: 11:00:00 via INTRAVENOUS

## 2017-06-27 SURGICAL SUPPLY — 89 items
BENZOIN TINCTURE PRP APPL 2/3 (GAUZE/BANDAGES/DRESSINGS) ×3 IMPLANT
BIT DRILL 3.2 (BIT) ×2
BIT DRILL 65X3.2XQC STP NS (BIT) ×1 IMPLANT
BIT DRL 65X3.2XQC STP NS (BIT) ×1
BLADE CLIPPER SURG (BLADE) IMPLANT
BONE VIVIGEN FORMABLE 10CC (Bone Implant) ×3 IMPLANT
BUR PRESCISION 1.7 ELITE (BURR) ×3 IMPLANT
BUR ROUND FLUTED 5 RND (BURR) ×2 IMPLANT
BUR ROUND FLUTED 5MM RND (BURR) ×1
BUR ROUND PRECISION 4.0 (BURR) IMPLANT
BUR ROUND PRECISION 4.0MM (BURR)
BUR SABER RD CUTTING 3.0 (BURR) IMPLANT
BUR SABER RD CUTTING 3.0MM (BURR)
CAGE CONCORDE BULLET 11X12X27 (Cage) ×2 IMPLANT
CAGE SPNL PRLL BLT NOSE 27X11 (Cage) ×1 IMPLANT
CARTRIDGE OIL MAESTRO DRILL (MISCELLANEOUS) ×1 IMPLANT
CLOSURE STERI-STRIP 1/2X4 (GAUZE/BANDAGES/DRESSINGS) ×1
CLOSURE WOUND 1/2 X4 (GAUZE/BANDAGES/DRESSINGS) ×2
CLSR STERI-STRIP ANTIMIC 1/2X4 (GAUZE/BANDAGES/DRESSINGS) ×2 IMPLANT
CONT SPEC 4OZ CLIKSEAL STRL BL (MISCELLANEOUS) ×3 IMPLANT
COVER MAYO STAND STRL (DRAPES) ×6 IMPLANT
COVER SURGICAL LIGHT HANDLE (MISCELLANEOUS) ×3 IMPLANT
DIFFUSER DRILL AIR PNEUMATIC (MISCELLANEOUS) ×3 IMPLANT
DRAIN CHANNEL 15F RND FF W/TCR (WOUND CARE) IMPLANT
DRAPE C-ARM 42X72 X-RAY (DRAPES) ×3 IMPLANT
DRAPE C-ARMOR (DRAPES) IMPLANT
DRAPE POUCH INSTRU U-SHP 10X18 (DRAPES) ×3 IMPLANT
DRAPE SURG 17X23 STRL (DRAPES) ×12 IMPLANT
DURAPREP 26ML APPLICATOR (WOUND CARE) ×3 IMPLANT
ELECT BLADE 4.0 EZ CLEAN MEGAD (MISCELLANEOUS) ×3
ELECT CAUTERY BLADE 6.4 (BLADE) ×3 IMPLANT
ELECT REM PT RETURN 9FT ADLT (ELECTROSURGICAL) ×3
ELECTRODE BLDE 4.0 EZ CLN MEGD (MISCELLANEOUS) ×1 IMPLANT
ELECTRODE REM PT RTRN 9FT ADLT (ELECTROSURGICAL) ×1 IMPLANT
EVACUATOR SILICONE 100CC (DRAIN) IMPLANT
FEE INTRAOP MONITOR IMPULS NCS (MISCELLANEOUS) ×1 IMPLANT
GAUZE SPONGE 4X4 12PLY STRL (GAUZE/BANDAGES/DRESSINGS) ×3 IMPLANT
GAUZE SPONGE 4X4 16PLY XRAY LF (GAUZE/BANDAGES/DRESSINGS) ×3 IMPLANT
GLOVE BIO SURGEON STRL SZ7 (GLOVE) ×3 IMPLANT
GLOVE BIO SURGEON STRL SZ8 (GLOVE) ×3 IMPLANT
GLOVE BIOGEL PI IND STRL 7.0 (GLOVE) ×1 IMPLANT
GLOVE BIOGEL PI IND STRL 8 (GLOVE) ×1 IMPLANT
GLOVE BIOGEL PI INDICATOR 7.0 (GLOVE) ×2
GLOVE BIOGEL PI INDICATOR 8 (GLOVE) ×2
GOWN STRL REUS W/ TWL LRG LVL3 (GOWN DISPOSABLE) ×2 IMPLANT
GOWN STRL REUS W/ TWL XL LVL3 (GOWN DISPOSABLE) ×1 IMPLANT
GOWN STRL REUS W/TWL LRG LVL3 (GOWN DISPOSABLE) ×4
GOWN STRL REUS W/TWL XL LVL3 (GOWN DISPOSABLE) ×2
INTRAOP MONITOR FEE IMPULS NCS (MISCELLANEOUS) ×1
INTRAOP MONITOR FEE IMPULSE (MISCELLANEOUS) ×2
IV CATH 14GX2 1/4 (CATHETERS) ×3 IMPLANT
KIT BASIN OR (CUSTOM PROCEDURE TRAY) ×3 IMPLANT
KIT POSITION SURG JACKSON T1 (MISCELLANEOUS) ×3 IMPLANT
KIT ROOM TURNOVER OR (KITS) ×3 IMPLANT
MARKER SKIN DUAL TIP RULER LAB (MISCELLANEOUS) ×3 IMPLANT
NDL SAFETY ECLIPSE 18X1.5 (NEEDLE) ×1 IMPLANT
NEEDLE 22X1 1/2 (OR ONLY) (NEEDLE) ×6 IMPLANT
NEEDLE HYPO 18GX1.5 SHARP (NEEDLE) ×2
NEEDLE HYPO 25GX1X1/2 BEV (NEEDLE) ×3 IMPLANT
NEEDLE SPNL 18GX3.5 QUINCKE PK (NEEDLE) ×6 IMPLANT
NS IRRIG 1000ML POUR BTL (IV SOLUTION) ×3 IMPLANT
OIL CARTRIDGE MAESTRO DRILL (MISCELLANEOUS) ×3
PACK LAMINECTOMY ORTHO (CUSTOM PROCEDURE TRAY) ×3 IMPLANT
PACK UNIVERSAL I (CUSTOM PROCEDURE TRAY) ×3 IMPLANT
PAD ARMBOARD 7.5X6 YLW CONV (MISCELLANEOUS) ×6 IMPLANT
PATTIES SURGICAL .5 X1 (DISPOSABLE) ×3 IMPLANT
PATTIES SURGICAL .5X1.5 (GAUZE/BANDAGES/DRESSINGS) ×3 IMPLANT
PROBE PEDCLE PROBE MAGSTM DISP (MISCELLANEOUS) ×3 IMPLANT
ROD PRE BENT EXP 40MM (Rod) ×6 IMPLANT
SCREW SET SINGLE INNER (Screw) ×12 IMPLANT
SCREW VIPER CORT FIX 6X35 (Screw) ×12 IMPLANT
SPONGE INTESTINAL PEANUT (DISPOSABLE) ×3 IMPLANT
SPONGE SURGIFOAM ABS GEL 100 (HEMOSTASIS) ×3 IMPLANT
STRIP CLOSURE SKIN 1/2X4 (GAUZE/BANDAGES/DRESSINGS) ×4 IMPLANT
SURGIFLO W/THROMBIN 8M KIT (HEMOSTASIS) ×3 IMPLANT
SUT MNCRL AB 4-0 PS2 18 (SUTURE) ×3 IMPLANT
SUT VIC AB 0 CT1 18XCR BRD 8 (SUTURE) ×1 IMPLANT
SUT VIC AB 0 CT1 8-18 (SUTURE) ×2
SUT VIC AB 1 CT1 18XCR BRD 8 (SUTURE) ×1 IMPLANT
SUT VIC AB 1 CT1 8-18 (SUTURE) ×2
SUT VIC AB 2-0 CT2 18 VCP726D (SUTURE) ×3 IMPLANT
SYR 20CC LL (SYRINGE) ×6 IMPLANT
SYR BULB IRRIGATION 50ML (SYRINGE) ×3 IMPLANT
SYR CONTROL 10ML LL (SYRINGE) ×6 IMPLANT
SYR TB 1ML LUER SLIP (SYRINGE) ×3 IMPLANT
TAPE CLOTH SURG 6X10 WHT LF (GAUZE/BANDAGES/DRESSINGS) ×3 IMPLANT
TRAY FOLEY W/METER SILVER 16FR (SET/KITS/TRAYS/PACK) ×3 IMPLANT
WATER STERILE IRR 1000ML POUR (IV SOLUTION) ×3 IMPLANT
YANKAUER SUCT BULB TIP NO VENT (SUCTIONS) ×3 IMPLANT

## 2017-06-27 NOTE — Op Note (Signed)
NAME:  Robert Knight, Prom NO.:  MEDICAL RECORD NO.:  [id number]  PHYSICIAN:  Robert Bob, MD           DATE OF BIRTH:  DATE OF PROCEDURE:  06/27/2017                              OPERATIVE REPORT   PREOPERATIVE DIAGNOSES: 1. Lumbar spinal stenosis, L4/5. 2. Left-sided lumbar radiculopathy. 3. Spondylolisthesis, L4-5.  POSTOPERATIVE DIAGNOSES: 1. Lumbar spinal stenosis, L4/5. 2. Left-sided lumbar radiculopathy. 3. Spondylolisthesis, L4-5.  PROCEDURE: 1. L4-5 decompression with bilateral partial facetectomy and     decompression of the L4-5 level. 2. Left-sided L4-5 transforaminal lumbar interbody fusion. 3. Right-sided L4-5 posterolateral fusion. 4. Insertion of interbody device x1 (12 x 27 mm Concorde Bullet cage). 5. Placement of posterior instrumentation L4, L5. 6. Use of local autograft. 7. Use of morselized allograft - ViviGen. 8. Intraoperative use of fluoroscopy.  SURGEON:  Robert Bob, MD.  ASSISTANTPricilla Holm, PA-C.  ANESTHESIA:  General endotracheal anesthesia.  COMPLICATIONS:  None.  DISPOSITION:  Stable.  ESTIMATED BLOOD LOSS:  100 mL.  INDICATIONS FOR SURGERY:  Briefly, [name] is a very pleasant 82 year old male, who did present to me with ongoing and rather debilitating pain in his left leg.  At the time of my evaluation with him, his pain was present for about 2 months.  He did undergo physical therapy without relief.  He did have steroids, with no substantial relief.  Other nonoperative measures did not help him.  Given his ongoing pain and dysfunction, we did discuss proceeding with the procedure noted above.  The patient was fully aware of the risks and limitations of surgery and did wish to proceed.  OPERATIVE DETAILS:  On June 27, 2017, the patient was brought to surgery and general endotracheal anesthesia was administered.  The patient was placed prone on a well-padded flat Jackson bed with a  Wilson frame.  Antibiotics were given and a time-out procedure was performed. The back was prepped and draped, and a midline incision was made overlying the patient's L4-5 intervertebral level.  An incision was made overlying the L4-5 intervertebral space.  The fascia was incised at the midline and the paraspinal musculature was bluntly swept laterally on the right and left sides.  Using anatomic landmarks in addition to fluoroscopy, I did cannulate the L4 and L5 pedicles bilaterally, using anatomic landmarks in addition to fluoroscopy.  On the right side, I did place pedicle screws, using a medial to lateral cortical trajectory technique.  On the right side, I did place 6 x 35 mm screws into the L4 and L5 pedicles.  A 40 mm rod was placed and distraction was applied across the rod and caps were placed and provisionally tightened.  A high- speed bur was used to decorticate the facet joint and posterior elements along the right side prior to placing the screws and rod to help aid in the success of the fusion.  On the left side, the L4 and L5 pedicles were cannulated in a similar manner, and bone wax was placed in the place.  At this point, I did proceed with a bilateral partial facetectomy with decompression of the right and left lateral recess.  A cyst was noted over the midline at the L4-5 level, which was removed.  A ventral portion of the cyst was adherent to the dura, and was left in place, as it was noncompressive.  After partial facetectomy was performed on the left side, I then proceeded with the transforaminal fusion portion of the procedure.  With an assistant holding medial retraction of the left L5 nerve, I did use a knife to perform an annulotomy at the posterolateral aspect of the L4-5 intervertebral space.  A series of curettes and paddle scrapers and pituitary rongeurs were used to perform a thorough and complete L4-5 intervertebral diskectomy.  After placing a series of  trials, I did feel that a 12 x 27 mm spacer would be the most appropriate fit.  The intervertebral space was then liberally packed with allograft and autograft, as was the intervertebral spacer, after which point was tamped into position.  I was very pleased with the final resting position of the implant on both AP and lateral fluoroscopic views.  Of note, the wound was copiously irrigated prior to placing the bone graft.  Abundant autograft and allograft were then placed into the posterolateral gutter on the right side.  On the left side, a 6 x 35 mm screws were placed into the L4 and L5 pedicles, and again, a 40 mm rod was placed and caps were placed and at this point, the distraction was released on the contralateral right side.  All 4 caps were then tightened using a final locking maneuver per the manufacturer's recommendations.  I did test the screws on the left side, and there was no screw that tested below 20 milliamps.  I was very pleased with the final AP and lateral fluoroscopic images.  At this point, the wound was closed in layers.  The fascia was closed using #1 Vicryl.  The subcutaneous layer was closed using 2-0 Vicryl and the skin was closed using 4-0 Monocryl.  Benzoin and Steri-Strips were then applied followed by sterile dressing.  All instrument counts were correct at the termination of the procedure.  Of note, Robert Holm, PA-C, was my assistant throughout surgery, and did aid in retraction, suctioning, and closure from start to finish.     Robert Bob, MD     MD/MEDQ  D:  06/27/2017  T:  06/27/2017  Job:  [id number]  cc:   Robert Richards, MD

## 2017-06-27 NOTE — Evaluation (Signed)
Physical Therapy Evaluation Patient Details Name: Robert Knight MRN: [id number] DOB: January 11, 1935 Today's Date: 06/27/2017   History of Present Illness  Pt is a 82 y/o male s/p L4-5 PLIF. PMH includes ataxia, HTN, L eye blindness, peripheral neuropathy, R arm fracture, and s/p L hip pin.   Clinical Impression  Pt is s/p surgery above with deficits below. PTA, pt was using rollator for ambulation secondary to pain in LLE. Upon eval, pt presenting with decreased balance, weakness, and post op pain, however, did report pain in his LLE was improved. Required min to min guard assist for mobility with rollator. Educated about getting RW to ensure upright posture with gait, however, pt refusing at this time. Reports wife will be able to assist at home. No DME recommendations at this time. Will continue to follow acutely to maximize functional mobility independence and safety.     Follow Up Recommendations No PT follow up;Supervision/Assistance - 24 hour    Equipment Recommendations  None recommended by PT(pt refusing RW )    Recommendations for Other Services       Precautions / Restrictions Precautions Precautions: Fall;Back Precaution Booklet Issued: Yes (comment) Precaution Comments: Reviewed back precautions with pt and daughter.  Required Braces or Orthoses: Spinal Brace Spinal Brace: Thoracolumbosacral orthotic;Applied in sitting position Restrictions Weight Bearing Restrictions: No      Mobility  Bed Mobility Overal bed mobility: Needs Assistance Bed Mobility: Rolling;Sidelying to Sit Rolling: Min guard Sidelying to sit: Min guard       General bed mobility comments: Min guard to ensure appropriate use of log roll technique.   Transfers Overall transfer level: Needs assistance Equipment used: 4-wheeled walker Transfers: Sit to/from Stand Sit to Stand: Min assist         General transfer comment: Min A for steadying assist. Verbal cues for safe hand placement.    Ambulation/Gait Ambulation/Gait assistance: Min guard Ambulation Distance (Feet): 300 Feet Assistive device: 4-wheeled walker Gait Pattern/deviations: Step-through pattern;Decreased stride length;Trunk flexed Gait velocity: Decreased Gait velocity interpretation: Below normal speed for age/gender General Gait Details: Slow, guarded gait. Overall steady with use of rollator, however, pt with slightly flexed posture. Cues for upright and rollator could not be adjusted any further. Recommended use of RW to ensure upright posture, however, pt refusing at this time.   Stairs            Wheelchair Mobility    Modified Rankin (Stroke Patients Only)       Balance Overall balance assessment: Needs assistance Sitting-balance support: No upper extremity supported;Feet supported Sitting balance-Leahy Scale: Good     Standing balance support: Bilateral upper extremity supported;During functional activity Standing balance-Leahy Scale: Poor Standing balance comment: Reliant on UE support                              Pertinent Vitals/Pain Pain Assessment: 0-10 Pain Score: 2  Pain Location: back  Pain Descriptors / Indicators: Aching;Operative site guarding Pain Intervention(s): Limited activity within patient's tolerance;Monitored during session;Repositioned    Home Living Family/patient expects to be discharged to:: Private residence Living Arrangements: Spouse/significant other Available Help at Discharge: Family;Available 24 hours/day Type of Home: House Home Access: Stairs to enter   [employer] of Steps: 1(threshold step ) Home Layout: One level Home Equipment: Walker - 4 wheels;Shower seat      Prior Function Level of Independence: Independent with assistive device(s)         Comments:  Used rollator secondary to pain      Hand Dominance        Extremity/Trunk Assessment   Upper Extremity Assessment Upper Extremity Assessment: Defer  to OT evaluation    Lower Extremity Assessment Lower Extremity Assessment: LLE deficits/detail;Generalized weakness LLE Deficits / Details: Reports L pain is improved from baseline     Cervical / Trunk Assessment Cervical / Trunk Assessment: Other exceptions Cervical / Trunk Exceptions: s/p PLIF   Communication   Communication: HOH  Cognition Arousal/Alertness: Awake/alert Behavior During Therapy: WFL for tasks assessed/performed Overall Cognitive Status: Within Functional Limits for tasks assessed                                        General Comments General comments (skin integrity, edema, etc.): Pt's daughter present during session. Educated about generalized walking program to perform at home. Practiced putting on clothes while sitting. Verbal and manual cues to maintain precautions throughout. Verbal cues to bring feet up to his lap.     Exercises     Assessment/Plan    PT Assessment Patient needs continued PT services  PT Problem List Decreased strength;Decreased balance;Decreased mobility;Decreased knowledge of use of DME;Decreased knowledge of precautions;Pain       PT Treatment Interventions DME instruction;Gait training;Stair training;Functional mobility training;Balance training;Therapeutic activities;Therapeutic exercise;Neuromuscular re-education;Patient/family education    PT Goals (Current goals can be found in the Care Plan section)  Acute Rehab PT Goals Patient Stated Goal: to go home tomorrow  PT Goal Formulation: With patient Time For Goal Achievement: 07/04/17 Potential to Achieve Goals: Good    Frequency Min 5X/week   Barriers to discharge        Co-evaluation               AM-PAC PT "6 Clicks" Daily Activity  Outcome Measure Difficulty turning over in bed (including adjusting bedclothes, sheets and blankets)?: Unable Difficulty moving from lying on back to sitting on the side of the bed? : Unable Difficulty sitting  down on and standing up from a chair with arms (e.g., wheelchair, bedside commode, etc,.)?: Unable Help needed moving to and from a bed to chair (including a wheelchair)?: A Little Help needed walking in hospital room?: A Little Help needed climbing 3-5 steps with a railing? : A Little 6 Click Score: 12    End of Session Equipment Utilized During Treatment: Gait belt;Back brace Activity Tolerance: Patient tolerated treatment well Patient left: in chair;with call bell/phone within reach;with family/visitor present Nurse Communication: Mobility status PT Visit Diagnosis: Unsteadiness on feet (R26.81);Other abnormalities of gait and mobility (R26.89);Pain Pain - part of body: (back)    Time: 5456-2563 PT Time Calculation (min) (ACUTE ONLY): 24 min   Charges:   PT Evaluation $PT Eval Low Complexity: 1 Low PT Treatments $Gait Training: 8-22 mins   PT G Codes:        Leighton Ruff, PT, DPT  Acute Rehabilitation Services  Pager: [phone]   Rudean Hitt 06/27/2017, 3:25 PM

## 2017-06-27 NOTE — Transfer of Care (Signed)
Immediate Anesthesia Transfer of Care Note  Patient: Robert Knight  Procedure(s) Performed: LEFT SIDED LUMBAR 4-5 TRANSFORAMINAL LUMBAR INTERBODY FUSION WITH INSTRUMENTATION AND ALLOGRAFT (Left )  Patient Location: PACU  Anesthesia Type:General  Level of Consciousness: awake, alert , oriented and sedated  Airway & Oxygen Therapy: Patient Spontanous Breathing and Patient connected to nasal cannula oxygen  Post-op Assessment: Report given to RN, Post -op Vital signs reviewed and stable and Patient moving all extremities  Post vital signs: Reviewed and stable  Last Vitals:  Vitals:   06/27/17 0634 06/27/17 1221  BP: (!) 154/72   Pulse: 71   Resp: 20   Temp: 36.5 C 36.7 C  SpO2: 99%     Last Pain:  Vitals:   06/27/17 0634  TempSrc: Oral      Patients Stated Pain Goal: 5 (44/03/47 4259)  Complications: No apparent anesthesia complications

## 2017-06-27 NOTE — Anesthesia Procedure Notes (Addendum)
Procedure Name: Intubation Date/Time: 06/27/2017 8:44 AM Performed by: Scheryl Darter, CRNA Pre-anesthesia Checklist: Patient identified, Emergency Drugs available, Suction available and Patient being monitored Patient Re-evaluated:Patient Re-evaluated prior to induction Oxygen Delivery Method: Circle System Utilized Preoxygenation: Pre-oxygenation with 100% oxygen Induction Type: IV induction Ventilation: Mask ventilation without difficulty Laryngoscope Size: Miller and 3 Grade View: Grade I Tube type: Oral Tube size: 8.0 mm Number of attempts: 1 Airway Equipment and Method: Stylet and Oral airway Placement Confirmation: ETT inserted through vocal cords under direct vision,  positive ETCO2 and breath sounds checked- equal and bilateral Tube secured with: Tape Dental Injury: Teeth and Oropharynx as per pre-operative assessment

## 2017-06-27 NOTE — Anesthesia Postprocedure Evaluation (Signed)
Anesthesia Post Note  Patient: Jamal Collin Salinas  Procedure(s) Performed: LEFT SIDED LUMBAR 4-5 TRANSFORAMINAL LUMBAR INTERBODY FUSION WITH INSTRUMENTATION AND ALLOGRAFT (Left )     Patient location during evaluation: PACU Anesthesia Type: General Level of consciousness: awake and alert Pain management: pain level controlled Vital Signs Assessment: post-procedure vital signs reviewed and stable Respiratory status: spontaneous breathing, nonlabored ventilation, respiratory function stable and patient connected to nasal cannula oxygen Cardiovascular status: blood pressure returned to baseline and stable Postop Assessment: no apparent nausea or vomiting Anesthetic complications: no    Last Vitals:  Vitals:   06/27/17 1305 06/27/17 1337  BP: 104/60 129/61  Pulse: 65 64  Resp: 12 16  Temp: (!) 36.3 C (!) 36.3 C  SpO2: 96% 98%    Last Pain:  Vitals:   06/27/17 1305  TempSrc:   PainSc: 3                  Aundra Pung EDWARD

## 2017-06-27 NOTE — Care Management Note (Signed)
Case Management Note  Patient Details  Name: Robert Knight MRN: [id number] Date of Birth: 20-May-1935  Subjective/Objective:   From home, s/p PLIF.                 Action/Plan: NCM will follow for dc needs.   Expected Discharge Date:                  Expected Discharge Plan:  Home/Self Care  In-House Referral:     Discharge planning Services  CM Consult  Post Acute Care Choice:    Choice offered to:     DME Arranged:    DME Agency:     HH Arranged:    [place] Agency:     Status of Service:  Completed, signed off  If discussed at [employer] of Stay Meetings, dates discussed:    Additional Comments:  Zenon Mayo, RN 06/27/2017, 4:11 PM

## 2017-06-27 NOTE — H&P (Signed)
PREOPERATIVE H&P  Chief Complaint: Left leg pain  HPI: Robert Knight is a 82 y.o. male who presents with ongoing pain in the left leg  MRI reveals severe spinal stenosis L4/5 with a grade 1 spondylolisthesis  Patient has failed multiple forms of conservative care and continues to have pain (see office notes for additional details regarding the patient's full course of treatment)  Past Medical History:  Diagnosis Date  . Anosmia   . Arm fracture    right arm  . Ataxia   . Colon polyp   . ED (erectile dysfunction)    of organic origin  . Elevated PSA   . Hypertension   . Hypothyroidism   . Legally blind in left eye, as defined in [place]   . Neuropathy    peripheral neuropathy  . Osteoarthritis   . Pneumonia 1960   Past Surgical History:  Procedure Laterality Date  . APPENDECTOMY    . CATARACT EXTRACTION  04/19/2010  . COLONOSCOPY WITH PROPOFOL N/A 01/04/2017   Procedure: COLONOSCOPY WITH PROPOFOL;  Surgeon: Lollie Sails, MD;  Location: [facility] ENDOSCOPY;  Service: Endoscopy;  Laterality: N/A;  . EYE SURGERY Left 2003   vitrectomy with TPA injection  . KNEE ARTHROSCOPY Bilateral 1973, 2000, 2002   1973 left, 2000 right, 2002 right  . left hip pin Left 1999  . rectal cauterization  1979  . TONSILLECTOMY     Social History   Socioeconomic History  . Marital status: Married    Spouse name: None  . Number of children: None  . Years of education: None  . Highest education level: None  Social Needs  . Financial resource strain: None  . Food insecurity - worry: None  . Food insecurity - inability: None  . Transportation needs - medical: None  . Transportation needs - non-medical: None  Occupational History  . None  Tobacco Use  . Smoking status: Former Smoker    Packs/day: 0.50    Types: Cigarettes    Last attempt to quit: 1959    Years since quitting: 59.9  . Smokeless tobacco: Never Used  Substance and Sexual Activity  . Alcohol use: No  . Drug use:  No  . Sexual activity: None  Other Topics Concern  . None  Social History Narrative  . None   History reviewed. No pertinent family history. No Known Allergies Prior to Admission medications   Medication Sig Start Date End Date Taking? Authorizing Provider  aspirin EC 81 MG tablet Take 81 mg by mouth daily.   Yes [provider]  Cholecalciferol 2000 units CAPS Take 2,000 Units daily by mouth.    Yes [provider]  Coenzyme Q10 100 MG capsule Take 100 mg by mouth daily.   Yes [provider]  fluticasone (FLONASE) 50 MCG/ACT nasal spray Place 2 sprays into both nostrils daily.   Yes [provider]  gabapentin (NEURONTIN) 100 MG capsule Take 100 mg 2 (two) times daily by mouth.   Yes [provider]  ibuprofen (ADVIL,MOTRIN) 600 MG tablet Take 600 mg 2 (two) times daily as needed by mouth for headache or moderate pain.   Yes [provider]  levothyroxine (SYNTHROID, LEVOTHROID) 75 MCG tablet Take 75 mcg by mouth daily before breakfast. Daily on an empty stomach with a glass of water at least 30 to 60 minutes before meal   Yes [provider]  lisinopril (PRINIVIL,ZESTRIL) 10 MG tablet Take 10 mg by mouth daily.  Yes [provider]     All other systems have been reviewed and were otherwise negative with the exception of those mentioned in the HPI and as above.  Physical Exam: Vitals:   06/27/17 0634  BP: (!) 154/72  Pulse: 71  Resp: 20  Temp: 97.7 F (36.5 C)  SpO2: 99%    General: Alert, no acute distress Cardiovascular: No pedal edema Respiratory: No cyanosis, no use of accessory musculature Skin: No lesions in the area of chief complaint Neurologic: Sensation intact distally Psychiatric: Patient is competent for consent with normal mood and affect Lymphatic: No axillary or cervical lymphadenopathy  MUSCULOSKELETAL: + SLR on the left  Assessment/Plan: Left leg pain Plan for  Procedure(s): LEFT SIDED LUMBAR 4-5 TRANSFORAMINAL LUMBAR INTERBODY FUSION WITH INSTRUMENTATION AND ALLOGRAFT   Sinclair Ship, MD 06/27/2017 8:05 AM

## 2017-06-28 NOTE — Progress Notes (Signed)
Physical Therapy Treatment Patient Details Name: Robert Knight MRN: [id number] DOB: [birth year] Today's Date: 06/28/2017    History of Present Illness Pt is a 82 y/o male s/p L4-5 PLIF. PMH includes ataxia, HTN, L eye blindness, peripheral neuropathy, R arm fracture, and s/p L hip pin.     PT Comments    Pt progressing well. Pt functioning at min guard. Pt with good home set up and support. Acute PT to con't to follow.   Follow Up Recommendations  No PT follow up;Supervision/Assistance - 24 hour     Equipment Recommendations  None recommended by PT    Recommendations for Other Services       Precautions / Restrictions Precautions Precautions: Fall;Back Precaution Booklet Issued: Yes (comment) Precaution Comments: pt with no recall, re-educated on precautions, went over hand out, discussed modifications to home Required Braces or Orthoses: Spinal Brace Spinal Brace: Thoracolumbosacral orthotic;Applied in sitting position Restrictions Weight Bearing Restrictions: No    Mobility  Bed Mobility Overal bed mobility: Needs Assistance Bed Mobility: Rolling;Sidelying to Sit Rolling: Min guard Sidelying to sit: Min guard       General bed mobility comments: used bed rail but maintained back precautions  Transfers Overall transfer level: Needs assistance Equipment used: 4-wheeled walker Transfers: Sit to/from Stand Sit to Stand: Min guard         General transfer comment: v/c's to push up from bed not pull up from walker, minimize trunk flexion  Ambulation/Gait Ambulation/Gait assistance: Min guard Ambulation Distance (Feet): 300 Feet Assistive device: 4-wheeled walker Gait Pattern/deviations: Step-through pattern;Decreased stride length;Trunk flexed Gait velocity: wfl Gait velocity interpretation: at or above normal speed for age/gender General Gait Details: v/c's to contract abdominals to minimize bending, rollator needs to be higher due to patients height however  rollator is at max height   Stairs Stairs: Yes   Stair Management: One rail Left Number of Stairs: 12 General stair comments: reciprocal  Wheelchair Mobility    Modified Rankin (Stroke Patients Only)       Balance Overall balance assessment: Needs assistance Sitting-balance support: No upper extremity supported Sitting balance-Leahy Scale: Good     Standing balance support: Bilateral upper extremity supported;During functional activity Standing balance-Leahy Scale: Fair                              Cognition Arousal/Alertness: Awake/alert Behavior During Therapy: WFL for tasks assessed/performed Overall Cognitive Status: Within Functional Limits for tasks assessed                                        Exercises      General Comments        Pertinent Vitals/Pain Pain Assessment: 0-10 Pain Score: 3  Pain Location: back Pain Descriptors / Indicators: Aching;Operative site guarding Pain Intervention(s): Limited activity within patient's tolerance    Home Living                      Prior Function            PT Goals (current goals can now be found in the care plan section) Progress towards PT goals: Progressing toward goals    Frequency    Min 5X/week      PT Plan Current plan remains appropriate    Co-evaluation  AM-PAC PT "6 Clicks" Daily Activity  Outcome Measure  Difficulty turning over in bed (including adjusting bedclothes, sheets and blankets)?: Unable Difficulty moving from lying on back to sitting on the side of the bed? : Unable Difficulty sitting down on and standing up from a chair with arms (e.g., wheelchair, bedside commode, etc,.)?: Unable Help needed moving to and from a bed to chair (including a wheelchair)?: A Little Help needed walking in hospital room?: A Little Help needed climbing 3-5 steps with a railing? : A Little 6 Click Score: 12    End of Session Equipment  Utilized During Treatment: Gait belt;Back brace Activity Tolerance: Patient tolerated treatment well Patient left: in chair;with call bell/phone within reach;with family/visitor present Nurse Communication: Mobility status PT Visit Diagnosis: Unsteadiness on feet (R26.81);Other abnormalities of gait and mobility (R26.89);Pain Pain - part of body: (back)     Time: 2518-9842 PT Time Calculation (min) (ACUTE ONLY): 21 min  Charges:  $Gait Training: 8-22 mins                    G Codes:       Kittie Plater, PT, DPT Pager #: [phone] Office #: [phone]    [place] 06/28/2017, 8:02 AM

## 2017-06-28 NOTE — Discharge Summary (Signed)
Patient ID: Robert Knight MRN: [id number] DOB/AGE: 08-Sep-1934 82 y.o.  Admit date: 06/27/2017 Discharge date: 06/28/2017  Admission Diagnoses:  Active Problems:   Radiculopathy   Discharge Diagnoses:  Same  Past Medical History:  Diagnosis Date  . Anosmia   . Arm fracture    right arm  . Ataxia   . Colon polyp   . ED (erectile dysfunction)    of organic origin  . Elevated PSA   . Hypertension   . Hypothyroidism   . Legally blind in left eye, as defined in [place]   . Neuropathy    peripheral neuropathy  . Osteoarthritis   . Pneumonia 1960    Surgeries: Procedure(s): LEFT SIDED LUMBAR 4-5 TRANSFORAMINAL LUMBAR INTERBODY FUSION WITH INSTRUMENTATION AND ALLOGRAFT on 06/27/2017   Consultants:   Discharged Condition: Improved  Hospital Course: Robert Knight is an 82 y.o. male who was admitted 06/27/2017 for operative treatment of left LE radiculopathy. Patient has severe unremitting pain that affects sleep, daily activities, and work/hobbies. After pre-op clearance the patient was taken to the operating room on 06/27/2017 and underwent  Procedure(s): LEFT SIDED LUMBAR 4-5 TRANSFORAMINAL LUMBAR INTERBODY FUSION WITH INSTRUMENTATION AND ALLOGRAFT.    Patient was given perioperative antibiotics:  Anti-infectives (From admission, onward)   Start     Dose/Rate Route Frequency Ordered Stop   06/27/17 1700  ceFAZolin (ANCEF) IVPB 2g/100 mL premix     2 g 200 mL/hr over 30 Minutes Intravenous Every 8 hours 06/27/17 1338 06/28/17 0100   06/27/17 0643  ceFAZolin (ANCEF) 2-4 GM/100ML-% IVPB    Comments:  Bobbie Stack   : cabinet override      06/27/17 0643 06/27/17 1859       Patient was given sequential compression devices, early ambulation, to prevent DVT.  Patient benefited maximally from hospital stay and there were no complications.    Recent vital signs:  Patient Vitals for the past 24 hrs:  BP Temp Temp src Pulse Resp SpO2  06/28/17 0411 113/64 98 F (36.7 C) Oral  82 16 96 %  06/27/17 2316 131/65 98.5 F (36.9 C) Oral 91 18 97 %  06/27/17 1915 138/66 98.3 F (36.8 C) Oral 87 17 96 %  06/27/17 1656 138/77 97.7 F (36.5 C) - 69 16 100 %  06/27/17 1337 129/61 (!) 97.4 F (36.3 C) - 64 16 98 %  06/27/17 1305 104/60 (!) 97.4 F (36.3 C) - 65 12 96 %  06/27/17 1250 (!) 103/56 - - 63 11 93 %  06/27/17 1235 109/61 - - 68 11 93 %  06/27/17 1232 - - - 77 19 95 %  06/27/17 1221 123/61 98 F (36.7 C) - 71 10 98 %     Recent laboratory studies: No results for input(s): WBC, HGB, HCT, PLT, NA, K, CL, CO2, BUN, CREATININE, GLUCOSE, INR, CALCIUM in the last 72 hours.  Invalid input(s): PT, 2   Discharge Medications:   Allergies as of 06/28/2017   No Known Allergies     Medication List    STOP taking these medications   aspirin EC 81 MG tablet     TAKE these medications   Cholecalciferol 2000 units Caps Take 2,000 Units daily by mouth.   Coenzyme Q10 100 MG capsule Take 100 mg by mouth daily.   fluticasone 50 MCG/ACT nasal spray Commonly known as:  FLONASE Place 2 sprays into both nostrils daily.   gabapentin 100 MG capsule Commonly known as:  NEURONTIN Take 100 mg  2 (two) times daily by mouth.   levothyroxine 75 MCG tablet Commonly known as:  SYNTHROID, LEVOTHROID Take 75 mcg by mouth daily before breakfast. Daily on an empty stomach with a glass of water at least 30 to 60 minutes before meal   lisinopril 10 MG tablet Commonly known as:  PRINIVIL,ZESTRIL Take 10 mg by mouth daily.       Diagnostic Studies: Dg Lumbar Spine 2-3 Views  Result Date: 06/27/2017 CLINICAL DATA:  L4-L5 TLIF. EXAM: LUMBAR SPINE - 2-3 VIEW; DG C-ARM 61-120 MIN COMPARISON:  MRI lumbar spine dated May 21, 2017. FINDINGS: Interval L4-L5 PLIF with interbody disc spacer. Vertebral body heights are preserved. Trace anterolisthesis of L4-L5 and trace retrolisthesis of L2-L3 are unchanged. Laparotomy pad overlying the L5-S1 level on the frontal view appears  external to the patient on the lateral view. IMPRESSION: Interval L4-L5 PLIF.  No definite hardware complication. FLUOROSCOPY TIME:  28 seconds. C-arm fluoroscopic images were obtained intraoperatively and submitted for post operative interpretation. Electronically Signed   By: Titus Dubin M.D.   On: 06/27/2017 12:35   Dg Lumbar Spine 1 View  Result Date: 06/27/2017 CLINICAL DATA:  Localization radiograph prior to lumbar procedure. EXAM: LUMBAR SPINE - 1 VIEW COMPARISON:  MRI of May 21, 2017 FINDINGS: The uppermost metallic needle lies approximately 8 mm posterior to the posterior margin of the L4 spinous process. The lower needle projects over the extreme posterior aspect of the L5 spinous process which appears to be fused with the S1 spinous process. IMPRESSION: Positioning of the localization needles as described. Electronically Signed   By: David  Martinique M.D.   On: 06/27/2017 12:38   Dg C-arm 1-60 Min  Result Date: 06/27/2017 CLINICAL DATA:  L4-L5 TLIF. EXAM: LUMBAR SPINE - 2-3 VIEW; DG C-ARM 61-120 MIN COMPARISON:  MRI lumbar spine dated May 21, 2017. FINDINGS: Interval L4-L5 PLIF with interbody disc spacer. Vertebral body heights are preserved. Trace anterolisthesis of L4-L5 and trace retrolisthesis of L2-L3 are unchanged. Laparotomy pad overlying the L5-S1 level on the frontal view appears external to the patient on the lateral view. IMPRESSION: Interval L4-L5 PLIF.  No definite hardware complication. FLUOROSCOPY TIME:  28 seconds. C-arm fluoroscopic images were obtained intraoperatively and submitted for post operative interpretation. Electronically Signed   By: Titus Dubin M.D.   On: 06/27/2017 12:35    Disposition: 01-Home or Self Care       Signed: Grier Mitts 06/28/2017, 7:30 AM

## 2017-06-28 NOTE — Evaluation (Signed)
Occupational Therapy Evaluation and Discharge Patient Details Name: Robert Knight MRN: [id number] DOB: 1934-12-13 Today's Date: 06/28/2017    History of Present Illness Pt is a 82 y/o male s/p L4-5 PLIF. PMH includes ataxia, HTN, L eye blindness, peripheral neuropathy, R arm fracture, and s/p L hip pin.    Clinical Impression   PTA Pt modified independent in ADL and mobility with RW. Pt is currently min guard for ADL with AE for LB and min guard for mobility with RW to bathroom. Back handout provided and reviewed adls in detail. Pt educated on: clothing between brace, never sleep in brace, set an alarm at night for medication, avoid sitting for long periods of time, correct bed positioning for sleeping, correct sequence for bed mobility, avoiding lifting more than 5 pounds and never wash directly over incision. AE for LB bathing/dressing was reviewed in full. All education is complete and patient indicates understanding. OT to sign off at this point. Thank you for the opportunity to serve this patient.       Follow Up Recommendations  No OT follow up;Supervision - Intermittent    Equipment Recommendations  None recommended by OT    Recommendations for Other Services       Precautions / Restrictions Precautions Precautions: Fall;Back Precaution Booklet Issued: Yes (comment) Precaution Comments: pt with no recall, re-educated on precautions, went over hand out, discussed modifications to home Required Braces or Orthoses: Spinal Brace Spinal Brace: Thoracolumbosacral orthotic;Applied in sitting position Restrictions Weight Bearing Restrictions: No      Mobility Bed Mobility Overal bed mobility: Needs Assistance Bed Mobility: Rolling;Sidelying to Sit Rolling: Min guard Sidelying to sit: Min guard       General bed mobility comments: Pt sitting OOB in recliner when OT entered  Transfers Overall transfer level: Needs assistance Equipment used: 4-wheeled walker Transfers:  Sit to/from Stand Sit to Stand: Min guard         General transfer comment: v/c's to push up from bed not pull up from walker, minimize trunk flexion    Balance Overall balance assessment: Needs assistance Sitting-balance support: No upper extremity supported Sitting balance-Leahy Scale: Good     Standing balance support: Bilateral upper extremity supported;During functional activity Standing balance-Leahy Scale: Fair                             ADL either performed or assessed with clinical judgement   ADL Overall ADL's : Needs assistance/impaired Eating/Feeding: Modified independent   Grooming: Min guard   Upper Body Bathing: Min guard   Lower Body Bathing: Min guard;Sitting/lateral leans;With adaptive equipment Lower Body Bathing Details (indicate cue type and reason): educated in long handle sponge Upper Body Dressing : Minimal assistance;Sitting Upper Body Dressing Details (indicate cue type and reason): to don brace - education provided Lower Body Dressing: Min guard;With adaptive equipment Lower Body Dressing Details (indicate cue type and reason): educated in AE (grabber/reacher, long handle shoe horn, sock aide) Pt has [employer] fast streamer at home Toilet Transfer: Min guard;Ambulation;RW   Toileting- Clothing Manipulation and Hygiene: Min guard Toileting - Clothing Manipulation Details (indicate cue type and reason): Pt able to perform peri care without bending or twisting Tub/ Shower Transfer: Min guard;Ambulation;Rolling walker   Functional mobility during ADLs: Min guard;Rolling walker General ADL Comments: Educated Pt in compensatory strategies for ADL (setting up on dominant side to prevent twisting, AE for LB bathing/dressing, cup method for oral care)     Vision  Baseline Vision/History: Wears glasses Wears Glasses: At all times Patient Visual Report: No change from baseline       Perception     Praxis      Pertinent Vitals/Pain Pain  Assessment: 0-10 Pain Score: 3  Pain Location: back Pain Descriptors / Indicators: Aching;Operative site guarding Pain Intervention(s): Monitored during session;Repositioned     Hand Dominance Right   Extremity/Trunk Assessment Upper Extremity Assessment Upper Extremity Assessment: Overall WFL for tasks assessed   Lower Extremity Assessment Lower Extremity Assessment: Defer to PT evaluation   Cervical / Trunk Assessment Cervical / Trunk Assessment: Other exceptions Cervical / Trunk Exceptions: s/p PLIF    Communication Communication Communication: HOH   Cognition Arousal/Alertness: Awake/alert Behavior During Therapy: WFL for tasks assessed/performed Overall Cognitive Status: Within Functional Limits for tasks assessed                                     General Comments       Exercises     Shoulder Instructions      Home Living Family/patient expects to be discharged to:: Private residence Living Arrangements: Spouse/significant other Available Help at Discharge: Family;Available 24 hours/day Type of Home: House Home Access: Stairs to enter Entrance Stairs-Number of Steps: 1(threshold)   Home Layout: One level     Bathroom Shower/Tub: Walk-in shower;Tub/shower unit   Bathroom Toilet: Handicapped height     Home Equipment: Environmental consultant - 4 wheels;Shower seat          Prior Functioning/Environment Level of Independence: Independent with assistive device(s)        Comments: Used rollator secondary to pain         OT Problem List: Decreased range of motion;Decreased activity tolerance;Impaired balance (sitting and/or standing);Decreased knowledge of use of DME or AE;Decreased knowledge of precautions;Pain      OT Treatment/Interventions:      OT Goals(Current goals can be found in the care plan section) Acute Rehab OT Goals Patient Stated Goal: to go home OT Goal Formulation: With patient Time For Goal Achievement: 07/12/17 Potential  to Achieve Goals: Good  OT Frequency:     Barriers to D/C:            Co-evaluation              AM-PAC PT "6 Clicks" Daily Activity     Outcome Measure Help from another person eating meals?: None Help from another person taking care of personal grooming?: A Little Help from another person toileting, which includes using toliet, bedpan, or urinal?: A Little Help from another person bathing (including washing, rinsing, drying)?: A Little Help from another person to put on and taking off regular upper body clothing?: A Little Help from another person to put on and taking off regular lower body clothing?: A Little 6 Click Score: 19   End of Session Equipment Utilized During Treatment: Back brace;Rolling walker;Gait belt Nurse Communication: Mobility status  Activity Tolerance: Patient tolerated treatment well Patient left: in chair;with call bell/phone within reach;with family/visitor present(wife enetered at the end of the session)  OT Visit Diagnosis: Unsteadiness on feet (R26.81);Pain                Time: 4742-5956 OT Time Calculation (min): 22 min Charges:  OT General Charges $OT Visit: 1 Visit OT Evaluation $OT Eval Moderate Complexity: 1 Mod G-Codes:     Hulda Humphrey OTR/L [facility]  06/28/2017, 8:52 AM

## 2017-06-28 NOTE — Progress Notes (Signed)
   PATIENT ID: Robert Knight   1 Day Post-Op Procedure(s) (LRB): LEFT SIDED LUMBAR 4-5 TRANSFORAMINAL LUMBAR INTERBODY FUSION WITH INSTRUMENTATION AND ALLOGRAFT (Left)  Subjective: Doing well, reports that preoperative left leg pain has completely resolved, some soreness at incision lumbar spine but tolerable, feels ready to go home today. Denies LE numbness/tingling.   Objective:  Vitals:   06/27/17 2316 06/28/17 0411  BP: 131/65 113/64  Pulse: 91 82  Resp: 18 16  Temp: 98.5 F (36.9 C) 98 F (36.7 C)  SpO2: 97% 96%     L dressing c/d/i Wiggles toes, distally NVI, strength equal  Labs:  No results for input(s): HGB in the last 72 hours.No results for input(s): WBC, RBC, HCT, PLT in the last 72 hours.No results for input(s): NA, K, CL, CO2, BUN, CREATININE, GLUCOSE, CALCIUM in the last 72 hours.  Assessment and Plan: 1 Day Post-Op Procedure(s) (LRB): LEFT SIDED LUMBAR 4-5 TRANSFORAMINAL LUMBAR INTERBODY FUSION WITH INSTRUMENTATION AND ALLOGRAFT (Left) Doing well with resolution of preop pain Continue w PT D/c home today when cleared by PT Fu with Dr. Lynann Bologna as schld, scrips in chart

## 2017-06-28 NOTE — Progress Notes (Signed)
Patient alert and oriented, mae's well, voiding adequate amount of urine, swallowing without difficulty, no c/o pain at time of discharge. Patient discharged home with family. Script and discharged instructions given to patient. Patient and family stated understanding of instructions given. Patient has an appointment with Dr. Dumonski 

## 2017-06-29 MED FILL — Thrombin For Soln Kit 20000 Unit: CUTANEOUS | Qty: 1 | Status: AC

## 2017-07-02 MED FILL — Sodium Chloride IV Soln 0.9%: INTRAVENOUS | Qty: 1000 | Status: AC

## 2017-07-02 MED FILL — Heparin Sodium (Porcine) Inj 1000 Unit/ML: INTRAMUSCULAR | Qty: 30 | Status: AC

## 2017-10-28 IMAGING — MR MR LUMBAR SPINE W/O CM
4 of 5 series · 15 of 48 positions shown · non-contrast
Comparison: None.

CLINICAL DATA: Low back pain with radiation

EXAM:
MRI LUMBAR SPINE WITHOUT CONTRAST
TECHNIQUE: Multiplanar, multisequence MR imaging of the lumbar spine was
performed. No intravenous contrast was administered.

[Series 2: T2 · sagittal · 4.0mm · 0.44mm/px · 6 of 17 slices shown (1 of 2)]
[im 1/17]
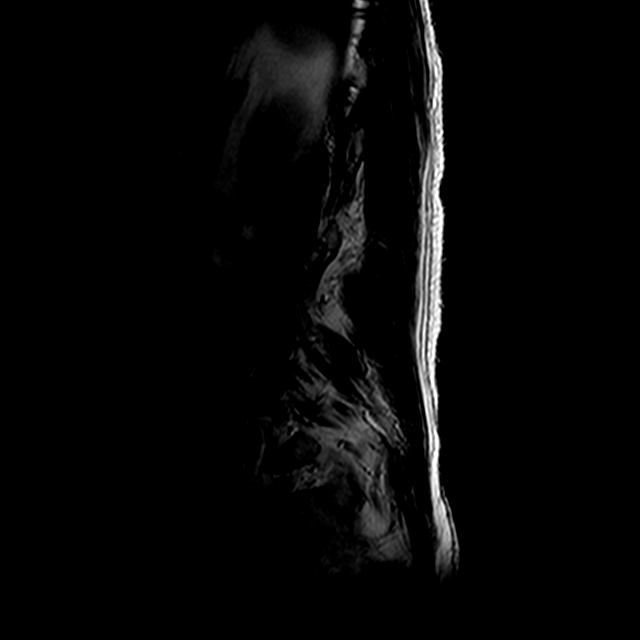
[im 4/17]
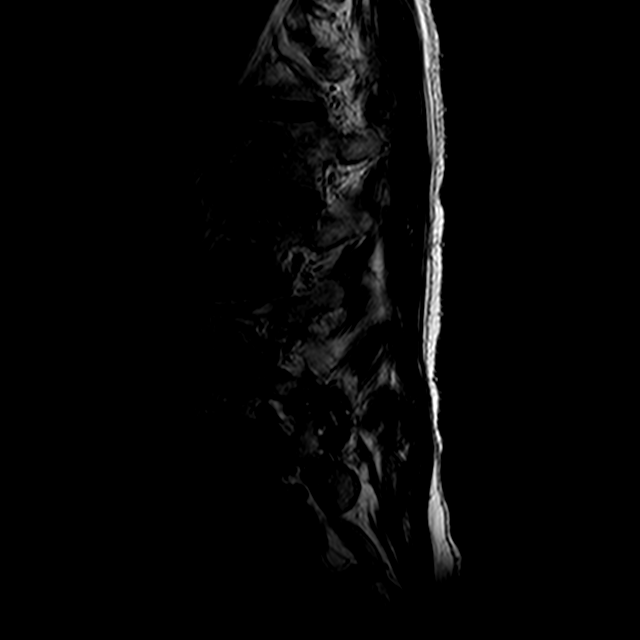
[im 7/17]
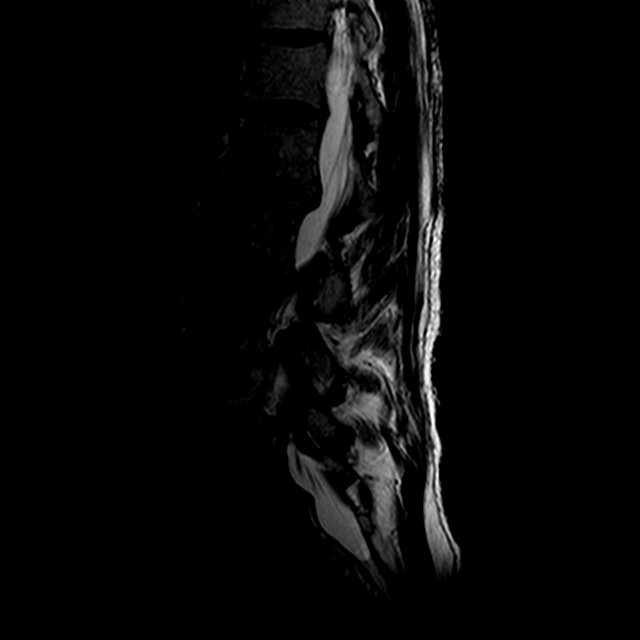
[im 10/17]
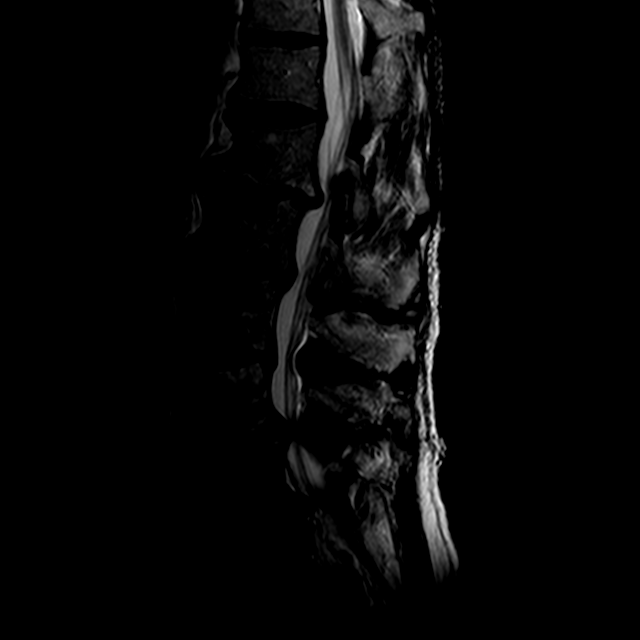
[im 13/17]
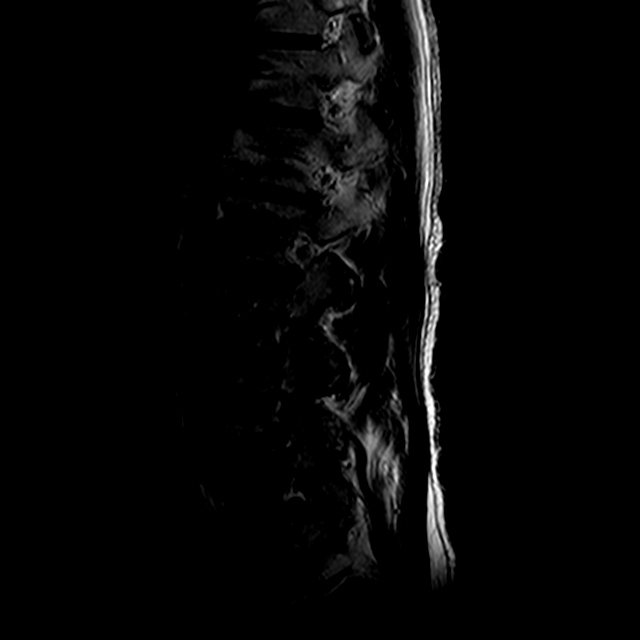
[im 17/17]
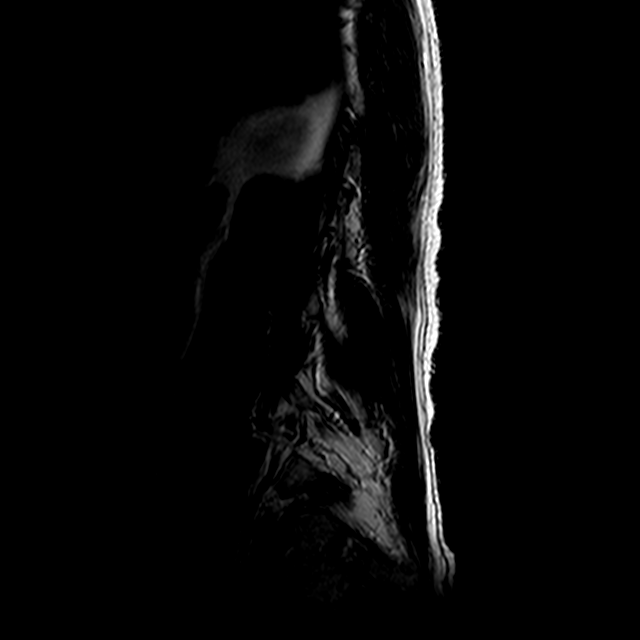

[Series 3: T1 · sagittal · 4.0mm · 0.44mm/px · 3 of 17 slices shown (1 of 2)]
[im 4/17]
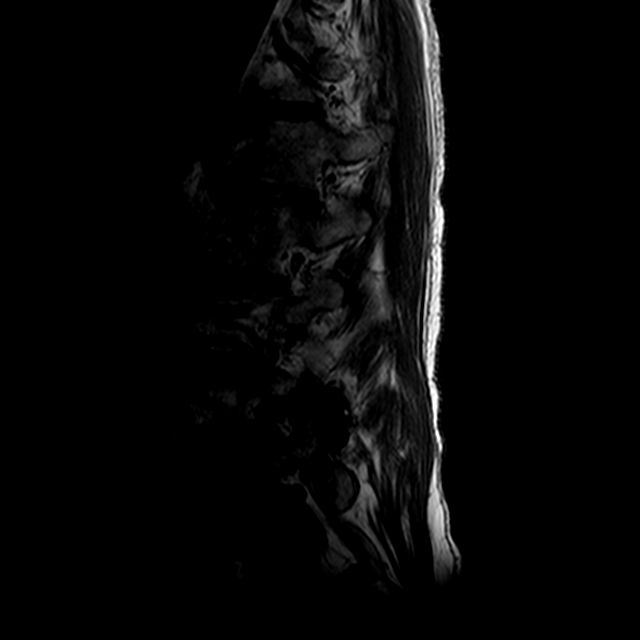
[im 10/17]
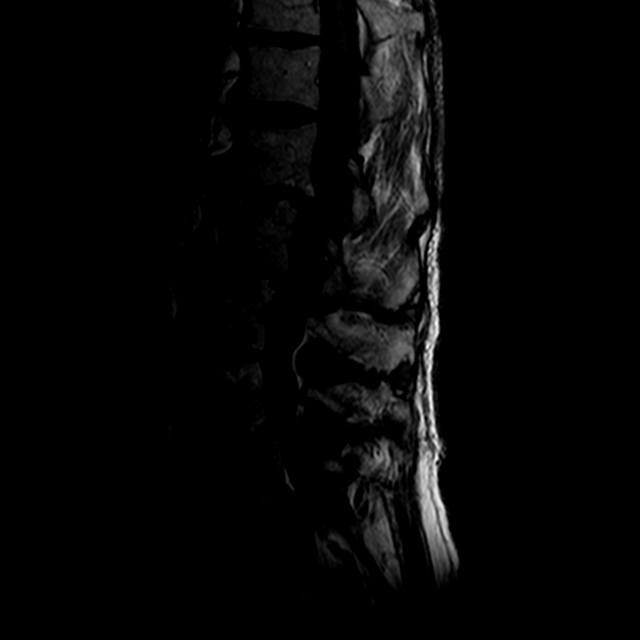
[im 17/17]
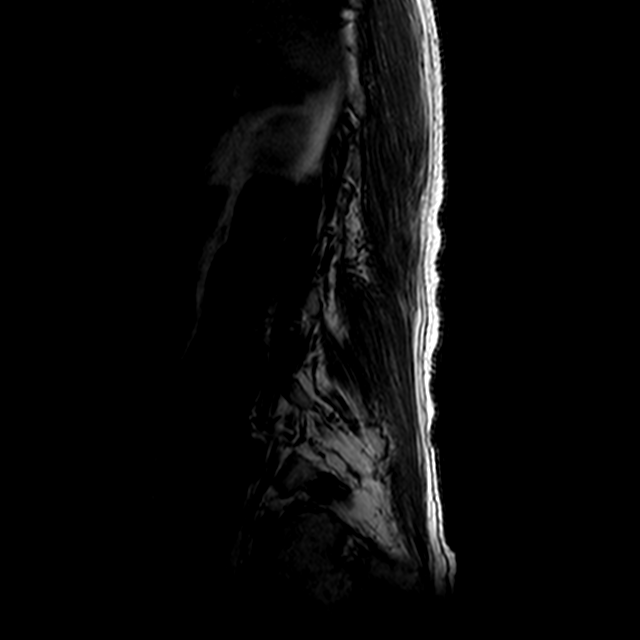

[Series 5: T2 · axial · 4.0mm · 0.39mm/px · z∈[-42,+149]mm · 3 of 43 slices shown (2 of 2)]
[im 7/43]
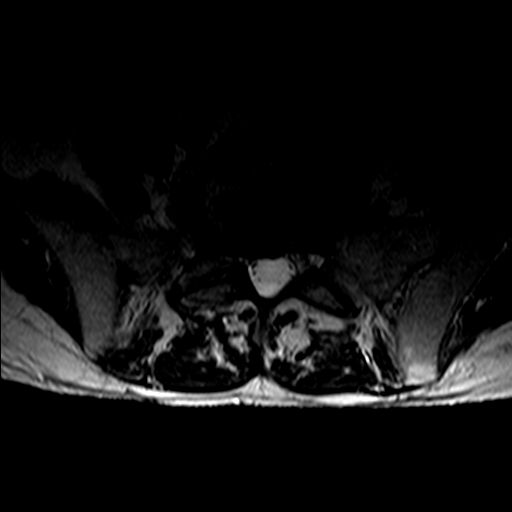
[im 22/43]
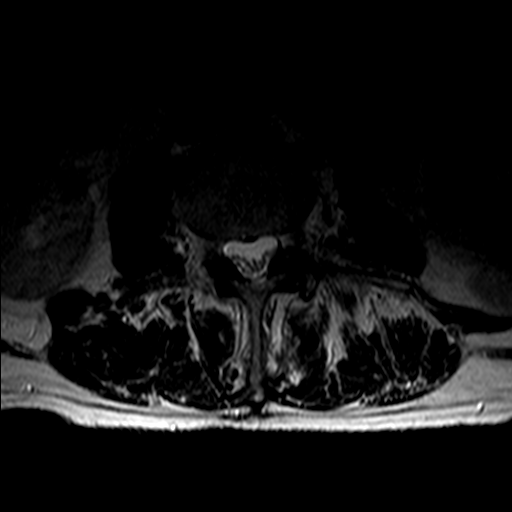
[im 37/43]
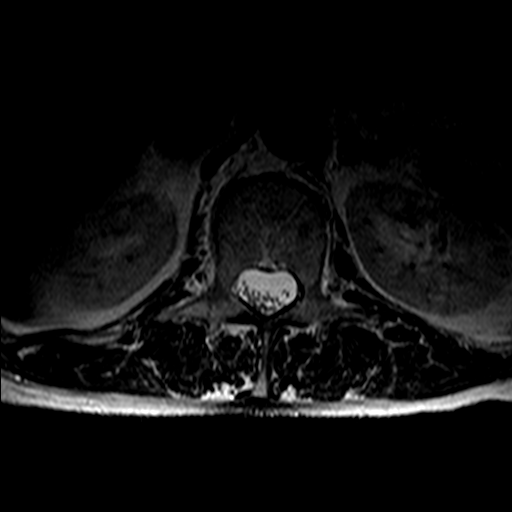

[Series 6: T1 · axial · 4.0mm · 0.39mm/px · z∈[-42,+149]mm · 3 of 43 slices shown (2 of 2)]
[im 7/43]
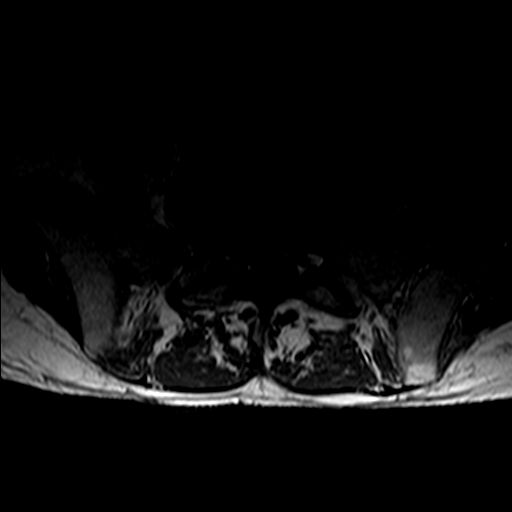
[im 22/43]
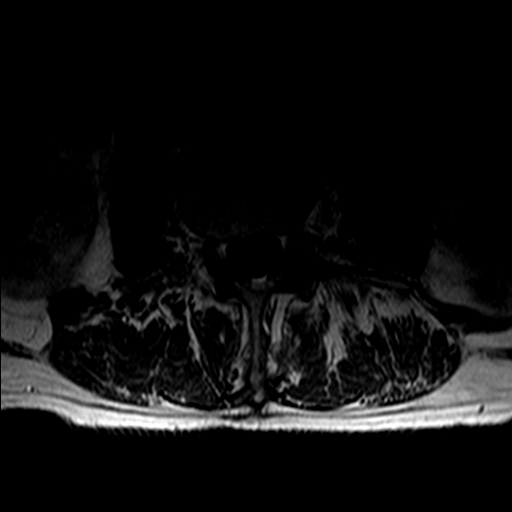
[im 37/43]
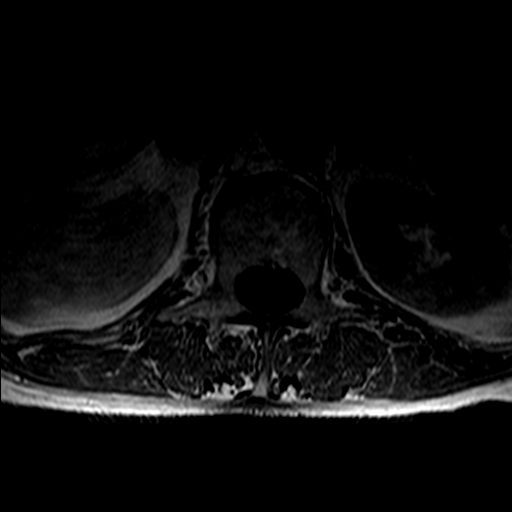

[15 of 48 positions shown; findings below may reference images not displayed]

FINDINGS: Segmentation:  Normal

Alignment: Mild retrolisthesis L1-2 L2-3. 3 mm anterolisthesis L4-5

Vertebrae:  Negative for fracture or mass

Conus medullaris: Extends to the T12-L1 level and appears normal.

Paraspinal and other soft tissues: Negative

Disc levels:

L1-2: Moderate disc degeneration and spurring. 3 mm retrolisthesis.
Subarticular and foraminal stenosis without significant canal
stenosis

L2-3: 3 mm retrolisthesis. Disc degeneration and spurring. Bilateral
facet hypertrophy. Mild spinal stenosis. Subarticular stenosis
bilaterally.

L3-4: Disc degeneration and spurring left greater than right.
Bilateral facet and ligamentum flavum hypertrophy causing moderate
spinal stenosis. Mild foraminal narrowing bilaterally.

L4-5: Severe spinal stenosis. 3 mm anterolisthesis. Diffuse disc
bulging. Advanced facet degeneration bilaterally. Posterior epidural
synovial cyst measures 5 x 10 mm and is compressing the thecal sac.
Subarticular stenosis bilaterally

L5-S1: Disc degeneration and endplate spurring without significant
spinal stenosis.
IMPRESSION: Subarticular and foraminal stenosis bilaterally L1-2.

Mild spinal stenosis L2-3 with subarticular stenosis bilaterally.

Moderate spinal stenosis L3-4 with mild foraminal narrowing
bilaterally

Severe spinal stenosis L5-S1. 5 x 10 mm posterior epidural synovial
cyst in the midline.

## 2017-12-27 ENCOUNTER — Other Ambulatory Visit: Payer: Self-pay | Admitting: Neurology

## 2017-12-27 ENCOUNTER — Other Ambulatory Visit (HOSPITAL_COMMUNITY): Payer: Self-pay | Admitting: Neurology

## 2017-12-27 DIAGNOSIS — G3184 Mild cognitive impairment, so stated: Secondary | ICD-10-CM

## 2017-12-29 DIAGNOSIS — F03A0 Unspecified dementia, mild, without behavioral disturbance, psychotic disturbance, mood disturbance, and anxiety: Secondary | ICD-10-CM | POA: Insufficient documentation

## 2017-12-29 DIAGNOSIS — F0390 Unspecified dementia without behavioral disturbance: Secondary | ICD-10-CM | POA: Insufficient documentation

## 2018-01-03 ENCOUNTER — Ambulatory Visit (HOSPITAL_COMMUNITY): Payer: Medicare Other

## 2018-01-14 ENCOUNTER — Ambulatory Visit (HOSPITAL_COMMUNITY)
Admission: RE | Admit: 2018-01-14 | Discharge: 2018-01-14 | Disposition: A | Discharge status: Home / Self Care | Payer: Medicare Other | Source: Ambulatory Visit | Attending: Neurology | Admitting: Neurology

## 2018-01-14 DIAGNOSIS — G3184 Mild cognitive impairment, so stated: Secondary | ICD-10-CM | POA: Diagnosis not present

## 2018-01-14 DIAGNOSIS — I7389 Other specified peripheral vascular diseases: Secondary | ICD-10-CM | POA: Diagnosis not present

## 2018-01-14 DIAGNOSIS — G319 Degenerative disease of nervous system, unspecified: Secondary | ICD-10-CM | POA: Insufficient documentation

## 2019-09-09 ENCOUNTER — Encounter: Payer: Self-pay | Admitting: Neurology

## 2019-09-09 ENCOUNTER — Telehealth: Payer: Self-pay | Admitting: Neurology

## 2019-09-09 ENCOUNTER — Ambulatory Visit (INDEPENDENT_AMBULATORY_CARE_PROVIDER_SITE_OTHER): Payer: Medicare HMO | Admitting: Neurology

## 2019-09-09 ENCOUNTER — Other Ambulatory Visit: Payer: Self-pay

## 2019-09-09 VITALS — BP 150/77 | HR 72 | Temp 97.3°F | Ht 77.0 in | Wt 163.5 lb

## 2019-09-09 DIAGNOSIS — F09 Unspecified mental disorder due to known physiological condition: Secondary | ICD-10-CM | POA: Diagnosis not present

## 2019-09-09 DIAGNOSIS — R413 Other amnesia: Secondary | ICD-10-CM | POA: Diagnosis not present

## 2019-09-09 DIAGNOSIS — E559 Vitamin D deficiency, unspecified: Secondary | ICD-10-CM | POA: Diagnosis not present
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
## 2019-09-09 NOTE — Telephone Encounter (Signed)
aetna/aetna medicare order sent to [facility]. They will reach out to the patient to schedule and obtain the auth.

## 2019-09-10 ENCOUNTER — Telehealth: Payer: Self-pay | Admitting: *Deleted

## 2019-09-10 LAB — VITAMIN D 25 HYDROXY (VIT D DEFICIENCY, FRACTURES): Vit D, 25-Hydroxy: 54.6 ng/mL (ref 30.0–100.0)

## 2019-09-10 NOTE — Telephone Encounter (Signed)
-----   Message from Britt Bottom, MD sent at 09/10/2019  8:28 AM EST ----- Vit D was fine.   We will let them lnow results of MRI and neurocognitive tests after we get results

## 2019-09-18 ENCOUNTER — Ambulatory Visit: Payer: Medicare Other

## 2019-10-02 ENCOUNTER — Ambulatory Visit: Payer: Medicare Other

## 2019-10-02 NOTE — Telephone Encounter (Signed)
Holli [id number] (exp. 09/30/19 to 03/28/20) patient is scheduled at GI for 10/09/19.

## 2019-10-06 ENCOUNTER — Encounter: Payer: Private Health Insurance - Indemnity | Admitting: Counselor

## 2019-10-09 ENCOUNTER — Ambulatory Visit
Admission: RE | Admit: 2019-10-09 | Discharge: 2019-10-09 | Disposition: A | Discharge status: Home / Self Care | Payer: Medicare HMO | Source: Ambulatory Visit | Attending: Neurology | Admitting: Neurology

## 2019-10-09 ENCOUNTER — Other Ambulatory Visit: Payer: Self-pay

## 2019-10-09 DIAGNOSIS — F09 Unspecified mental disorder due to known physiological condition: Secondary | ICD-10-CM

## 2019-10-09 DIAGNOSIS — R413 Other amnesia: Secondary | ICD-10-CM

## 2019-10-13 ENCOUNTER — Encounter: Payer: Private Health Insurance - Indemnity | Admitting: Counselor

## 2019-10-13 ENCOUNTER — Telehealth: Payer: Self-pay | Admitting: Neurology

## 2019-10-13 NOTE — Telephone Encounter (Signed)
I called to go over the results of the brain MRI.  The phone call went to voicemail.  I left a voicemail.  The MRI of the brain shows that since 2015 there has been progression of the atrophy.  It is generalized.  He also has minimal chronic microvascular ischemic changes that are probably not significant.

## 2019-10-15 NOTE — Telephone Encounter (Signed)
I called again but got voicemail.  Could you please try to call him (or his wife) tomorrow to let them know that the MRI did show that the atrophy was worse but otherwise looked okay.  Atrophy is often seen with cognitive and memory issues.

## 2019-10-16 NOTE — Telephone Encounter (Signed)
Called, LVM for wife about results (ok per DPR). Asked them to call back if any further questions or concerns.

## 2020-01-23 ENCOUNTER — Encounter: Payer: Self-pay | Admitting: Urology

## 2020-01-23 ENCOUNTER — Ambulatory Visit (INDEPENDENT_AMBULATORY_CARE_PROVIDER_SITE_OTHER): Payer: Medicare HMO | Admitting: Urology

## 2020-01-23 ENCOUNTER — Other Ambulatory Visit: Payer: Self-pay

## 2020-01-23 VITALS — BP 185/77 | HR 80 | Ht 76.0 in | Wt 157.0 lb

## 2020-01-23 DIAGNOSIS — R972 Elevated prostate specific antigen [PSA]: Secondary | ICD-10-CM

## 2020-01-23 DIAGNOSIS — R351 Nocturia: Secondary | ICD-10-CM

## 2020-01-23 NOTE — Progress Notes (Signed)
01/23/2020 10:33 AM   Robert Knight 26-Oct-1934 [id number]  Referring provider: Kirk Ruths, MD [place] [place],  [place] [address]  Chief Complaint  Patient presents with   Elevated PSA    HPI:  [name] was referred over for elevated PSA.  He is 85 years old.  PSA was 5.3 on June 2021.  PSA was 4.10 November 2014. No FH PCa. No prior bx. No h/o BPH. He has noc x 1 in the early. He has a good stream. He has ED.   Comorbidities include dementia.  PMH: Past Medical History:  Diagnosis Date   Anosmia    Arm fracture    right arm   Ataxia    Colon polyp    ED (erectile dysfunction)    of organic origin   Elevated PSA    Hypertension    Hypothyroidism    Legally blind in left eye, as defined in [place]    Neuropathy    peripheral neuropathy   Osteoarthritis    Pneumonia 1960    Surgical History: Past Surgical History:  Procedure Laterality Date   APPENDECTOMY     CATARACT EXTRACTION  04/19/2010   COLONOSCOPY WITH PROPOFOL N/A 01/04/2017   Procedure: COLONOSCOPY WITH PROPOFOL;  Surgeon: Lollie Sails, MD;  Location: [facility] ENDOSCOPY;  Service: Endoscopy;  Laterality: N/A;   EYE SURGERY Left 2003   vitrectomy with TPA injection   KNEE ARTHROSCOPY Bilateral 1973, 2000, 2002   1973 left, 2000 right, 2002 right   left hip pin Left 1999   LUMBAR LAMINECTOMY     rectal cauterization  1979   TONSILLECTOMY      Home Medications:  Allergies as of 01/23/2020   No Known Allergies     Medication List       Accurate as of January 23, 2020 10:33 AM. If you have any questions, ask your nurse or doctor.        aspirin EC 81 MG tablet Take 81 mg by mouth daily.   Cholecalciferol 50 MCG (2000 UT) Caps Take 700 Units by mouth daily.   Co Q-10 100 MG Caps Take 1 capsule by mouth daily.   fluticasone 50 MCG/ACT nasal spray Commonly known as: FLONASE Place 2 sprays into both nostrils daily.   levothyroxine 75  MCG tablet Commonly known as: SYNTHROID Take 75 mcg by mouth daily before breakfast. Daily on an empty stomach with a glass of water at least 30 to 60 minutes before meal   lisinopril 10 MG tablet Commonly known as: ZESTRIL Take 10 mg by mouth daily.   multivitamin tablet Take 1 tablet by mouth daily.   OMEGA 3-6-9 COMPLEX PO Take 3-360 mg by mouth daily.   PRESERVISION AREDS 2 PO Take 1 Dose by mouth daily.   sildenafil 20 MG tablet Commonly known as: REVATIO Take 20 mg by mouth as needed.   tobramycin-dexamethasone ophthalmic solution Commonly known as: TOBRADEX tobramycin 0.3 %-dexamethasone 0.1 % eye drops,suspension       Allergies: No Known Allergies  Family History: No family history on file.  Social History:  reports that he quit smoking about 62 years ago. His smoking use included cigarettes. He smoked 0.50 packs per day. He has never used smokeless tobacco. He reports that he does not drink alcohol and does not use drugs.   Physical Exam: BP (!) 185/77    Pulse 80    Ht 6\' 4"  (1.93 m)  Wt 157 lb (71.2 kg)    BMI 19.11 kg/m   Constitutional:  Alert and oriented, No acute distress. HEENT: [place] AT, moist mucus membranes.  Trachea midline, no masses. Cardiovascular: No clubbing, cyanosis, or edema. Respiratory: Normal respiratory effort, no increased work of breathing. GI: Abdomen is soft, nontender, nondistended, no abdominal masses GU: No CVA tenderness; DRE with 40 gram prostate, R>L and all landmarks preserved.  Lymph: No cervical or inguinal lymphadenopathy. Skin: No rashes, bruises or suspicious lesions. Neurologic: Grossly intact, no focal deficits, moving all 4 extremities. Psychiatric: Normal mood and affect.  Laboratory Data: Lab Results  Component Value Date   WBC 5.2 06/20/2017   HGB 15.1 06/20/2017   HCT 45.1 06/20/2017   MCV 92.2 06/20/2017   PLT 204 06/20/2017    Lab Results  Component Value Date   CREATININE 0.92 06/20/2017    No  results found for: PSA  No results found for: TESTOSTERONE  No results found for: HGBA1C  Urinalysis    Component Value Date/Time   COLORURINE YELLOW 06/20/2017 0903   APPEARANCEUR CLEAR 06/20/2017 0903   LABSPEC 1.006 06/20/2017 0903   PHURINE 6.0 06/20/2017 0903   GLUCOSEU NEGATIVE 06/20/2017 0903   HGBUR NEGATIVE 06/20/2017 0903   [place] 06/20/2017 [place] 06/20/2017 0903   PROTEINUR NEGATIVE 06/20/2017 0903   NITRITE NEGATIVE 06/20/2017 0903   LEUKOCYTESUR NEGATIVE 06/20/2017 0903    No results found for: LABMICR, WBCUA, RBCUA, LABEPIT, MUCUS, BACTERIA  Pertinent Imaging: n/a No results found for this or any previous visit.  No results found for this or any previous visit.  No results found for this or any previous visit.  No results found for this or any previous visit.  No results found for this or any previous visit.  No results found for this or any previous visit.  No results found for this or any previous visit.  No results found for this or any previous visit.   Assessment & Plan:    1. Elevated PSA Pt is here with his wife and we went over the nature of PSA screening and PSA elevation. We discussed with an asymmetric exam, 85 yo and PSA rise he could very well have prostate cancer.  However all the landmarks were preserved and his PSA is slowly rising.  We went over the nature risks benefits and alternatives to prostate biopsy including active surveillance we also contrasted this with watchful waiting.  After careful consideration we will proceed with watchful waiting.  They had a neighbor who had "stage V prostate cancer that progressed beyond stage IV prostate cancer.  The patient died from prostate cancer.  We discussed that is a possibility down the road but things do seem to be stable over the last few years at least.  We discussed once prostate cancer spreads it is incurable but we could manage it with medications.  Again  all questions answered and they favor watchful waiting and no PSA screening.  I can see him back in a year to check symptoms and do another exam.  She said they may cancel if he is doing well.  - Urinalysis, Complete   No follow-ups on file.  Festus Aloe, MD  [facility] [address], [place], [place] [address] [phone]

## 2020-01-26 LAB — URINALYSIS, COMPLETE
Bilirubin, UA: NEGATIVE
Glucose, UA: NEGATIVE
Ketones, UA: NEGATIVE
Leukocytes,UA: NEGATIVE
Nitrite, UA: NEGATIVE
Protein,UA: NEGATIVE
RBC, UA: NEGATIVE
Specific Gravity, UA: 1.025 (ref 1.005–1.030)
Urobilinogen, Ur: 1 mg/dL (ref 0.2–1.0)
pH, UA: 6.5 (ref 5.0–7.5)

## 2020-01-26 LAB — MICROSCOPIC EXAMINATION: Bacteria, UA: NONE SEEN

## 2020-02-27 ENCOUNTER — Ambulatory Visit: Payer: Self-pay | Admitting: Urology

## 2020-03-05 ENCOUNTER — Emergency Department: Payer: Medicare HMO

## 2020-03-05 ENCOUNTER — Encounter: Payer: Self-pay | Admitting: *Deleted

## 2020-03-05 ENCOUNTER — Inpatient Hospital Stay
Admission: EM | Admit: 2020-03-05 | Discharge: 2020-03-08 | DRG: 522 | Disposition: A | Discharge status: Home Health | Payer: Medicare HMO | Attending: Internal Medicine | Admitting: Internal Medicine

## 2020-03-05 ENCOUNTER — Other Ambulatory Visit: Payer: Self-pay

## 2020-03-05 DIAGNOSIS — Z8719 Personal history of other diseases of the digestive system: Secondary | ICD-10-CM | POA: Diagnosis not present

## 2020-03-05 DIAGNOSIS — I4510 Unspecified right bundle-branch block: Secondary | ICD-10-CM | POA: Diagnosis present

## 2020-03-05 DIAGNOSIS — R739 Hyperglycemia, unspecified: Secondary | ICD-10-CM | POA: Diagnosis present

## 2020-03-05 DIAGNOSIS — E039 Hypothyroidism, unspecified: Secondary | ICD-10-CM | POA: Diagnosis present

## 2020-03-05 DIAGNOSIS — D72829 Elevated white blood cell count, unspecified: Secondary | ICD-10-CM | POA: Diagnosis not present

## 2020-03-05 DIAGNOSIS — Z79899 Other long term (current) drug therapy: Secondary | ICD-10-CM

## 2020-03-05 DIAGNOSIS — W109XXA Fall (on) (from) unspecified stairs and steps, initial encounter: Secondary | ICD-10-CM | POA: Diagnosis present

## 2020-03-05 DIAGNOSIS — Z87891 Personal history of nicotine dependence: Secondary | ICD-10-CM

## 2020-03-05 DIAGNOSIS — M79609 Pain in unspecified limb: Secondary | ICD-10-CM

## 2020-03-05 DIAGNOSIS — I10 Essential (primary) hypertension: Secondary | ICD-10-CM | POA: Diagnosis present

## 2020-03-05 DIAGNOSIS — Z20822 Contact with and (suspected) exposure to covid-19: Secondary | ICD-10-CM | POA: Diagnosis present

## 2020-03-05 DIAGNOSIS — Y92009 Unspecified place in unspecified non-institutional (private) residence as the place of occurrence of the external cause: Secondary | ICD-10-CM | POA: Diagnosis not present

## 2020-03-05 DIAGNOSIS — Z79890 Hormone replacement therapy (postmenopausal): Secondary | ICD-10-CM | POA: Diagnosis not present

## 2020-03-05 DIAGNOSIS — G3184 Mild cognitive impairment, so stated: Secondary | ICD-10-CM | POA: Diagnosis present

## 2020-03-05 DIAGNOSIS — Z419 Encounter for procedure for purposes other than remedying health state, unspecified: Secondary | ICD-10-CM

## 2020-03-05 DIAGNOSIS — S72011A Unspecified intracapsular fracture of right femur, initial encounter for closed fracture: Secondary | ICD-10-CM | POA: Diagnosis present

## 2020-03-05 DIAGNOSIS — Z7982 Long term (current) use of aspirin: Secondary | ICD-10-CM | POA: Diagnosis not present

## 2020-03-05 DIAGNOSIS — S72001A Fracture of unspecified part of neck of right femur, initial encounter for closed fracture: Secondary | ICD-10-CM | POA: Diagnosis present

## 2020-03-05 DIAGNOSIS — Z96649 Presence of unspecified artificial hip joint: Secondary | ICD-10-CM

## 2020-03-05 LAB — CBC WITH DIFFERENTIAL/PLATELET
Abs Immature Granulocytes: 0.08 10*3/uL — ABNORMAL HIGH (ref 0.00–0.07)
Basophils Absolute: 0 10*3/uL (ref 0.0–0.1)
Basophils Relative: 0 %
Eosinophils Absolute: 0.1 10*3/uL (ref 0.0–0.5)
Eosinophils Relative: 1 %
HCT: 43.1 % (ref 39.0–52.0)
Hemoglobin: 15.2 g/dL (ref 13.0–17.0)
Immature Granulocytes: 1 %
Lymphocytes Relative: 11 %
Lymphs Abs: 1.4 10*3/uL (ref 0.7–4.0)
MCH: 31.7 pg (ref 26.0–34.0)
MCHC: 35.3 g/dL (ref 30.0–36.0)
MCV: 89.8 fL (ref 80.0–100.0)
Monocytes Absolute: 0.6 10*3/uL (ref 0.1–1.0)
Monocytes Relative: 5 %
Neutro Abs: 10.4 10*3/uL — ABNORMAL HIGH (ref 1.7–7.7)
Neutrophils Relative %: 82 %
Platelets: 207 10*3/uL (ref 150–400)
RBC: 4.8 MIL/uL (ref 4.22–5.81)
RDW: 13 % (ref 11.5–15.5)
WBC: 12.6 10*3/uL — ABNORMAL HIGH (ref 4.0–10.5)
nRBC: 0 % (ref 0.0–0.2)

## 2020-03-05 LAB — BASIC METABOLIC PANEL
Anion gap: 14 (ref 5–15)
BUN: 18 mg/dL (ref 8–23)
CO2: 23 mmol/L (ref 22–32)
Calcium: 9.4 mg/dL (ref 8.9–10.3)
Chloride: 105 mmol/L (ref 98–111)
Creatinine, Ser: 0.96 mg/dL (ref 0.61–1.24)
GFR calc Af Amer: >60 mL/min (ref >60)
GFR calc non Af Amer: >60 mL/min (ref >60)
Glucose, Bld: 113 mg/dL — ABNORMAL HIGH (ref 70–99)
Potassium: 3.9 mmol/L (ref 3.5–5.1)
Sodium: 142 mmol/L (ref 135–145)

## 2020-03-05 LAB — SARS CORONAVIRUS 2 BY RT PCR (HOSPITAL ORDER, PERFORMED IN ~~LOC~~ HOSPITAL LAB): SARS Coronavirus 2: NEGATIVE

## 2020-03-05 LAB — TYPE AND SCREEN
ABO/RH(D): O POS
Antibody Screen: NEGATIVE

## 2020-03-05 MED ORDER — SODIUM CHLORIDE 0.9 % IV SOLN: INTRAVENOUS | Status: DC

## 2020-03-05 MED ORDER — LABETALOL HCL 5 MG/ML IV SOLN: 10.0000 mg | INTRAVENOUS | Status: DC | PRN

## 2020-03-05 MED ORDER — MORPHINE SULFATE (PF) 4 MG/ML IV SOLN
4.0000 mg | Freq: Once | INTRAVENOUS | Status: AC
  Administered 2020-03-05: 4 mg via INTRAVENOUS
  Filled 2020-03-05: qty 1

## 2020-03-05 MED ORDER — MORPHINE SULFATE (PF) 2 MG/ML IV SOLN
1.0000 mg | INTRAVENOUS | Status: DC | PRN
  Administered 2020-03-06: 2 mg via INTRAVENOUS
  Administered 2020-03-06: 1 mg via INTRAVENOUS
  Filled 2020-03-05 (×2): qty 1

## 2020-03-05 MED ORDER — ONDANSETRON HCL 4 MG/2ML IJ SOLN: 4.0000 mg | Freq: Four times a day (QID) | INTRAMUSCULAR | Status: DC | PRN

## 2020-03-05 MED ORDER — CEFAZOLIN SODIUM-DEXTROSE 1-4 GM/50ML-% IV SOLN
1.0000 g | Freq: Once | INTRAVENOUS | Status: DC
  Filled 2020-03-05: qty 50

## 2020-03-05 MED ORDER — ONDANSETRON HCL 4 MG/2ML IJ SOLN
4.0000 mg | Freq: Once | INTRAMUSCULAR | Status: AC
  Administered 2020-03-05: 4 mg via INTRAVENOUS
  Filled 2020-03-05: qty 2

## 2020-03-05 NOTE — ED Provider Notes (Signed)
[facility] Emergency Department Provider Note ____________________________________________  Time seen: Approximately 4:17 PM  I have reviewed the triage vital signs and the nursing notes.   HISTORY  Chief Complaint Fall    HPI Robert Knight is a 85 y.o. male who presents to the emergency department via EMS for evaluation and treatment of mechanical, non-syncopal fall at home. He fell down 2 steps and landed on his right hip/right upper leg. No head strike or loss of consciousness and he is not on blood thinner. No alleviating measures prior to arrival.   Past Medical History:  Diagnosis Date   Anosmia    Arm fracture    right arm   Ataxia    Colon polyp    ED (erectile dysfunction)    of organic origin   Elevated PSA    Hypertension    Hypothyroidism    Legally blind in left eye, as defined in [place]    Neuropathy    peripheral neuropathy   Osteoarthritis    Pneumonia 1960    Patient Active Problem List   Diagnosis Date Noted   Closed right hip fracture, initial encounter ([place]) 03/05/2020   Hypertension    Hypothyroidism    Radiculopathy 06/27/2017    Past Surgical History:  Procedure Laterality Date   APPENDECTOMY     CATARACT EXTRACTION  04/19/2010   COLONOSCOPY WITH PROPOFOL N/A 01/04/2017   Procedure: COLONOSCOPY WITH PROPOFOL;  Surgeon: Lollie Sails, MD;  Location: [facility] ENDOSCOPY;  Service: Endoscopy;  Laterality: N/A;   EYE SURGERY Left 2003   vitrectomy with TPA injection   KNEE ARTHROSCOPY Bilateral 1973, 2000, 2002   1973 left, 2000 right, 2002 right   left hip pin Left 1999   LUMBAR LAMINECTOMY     rectal cauterization  1979   TONSILLECTOMY      Prior to Admission medications   Medication Sig Start Date End Date Taking? Authorizing Provider  aspirin EC 81 MG tablet Take 81 mg by mouth daily.   Yes [provider]  Cholecalciferol 2000 units CAPS Take 2,000 Units by mouth daily.    Yes  [provider]  Coenzyme Q10 (CO Q-10) 30 MG CAPS Take 30 mg by mouth daily.    Yes [provider]  fluticasone (FLONASE) 50 MCG/ACT nasal spray Place 1 spray into both nostrils 2 (two) times daily as needed for allergies or rhinitis.    Yes [provider]  levothyroxine (SYNTHROID, LEVOTHROID) 75 MCG tablet Take 75 mcg by mouth daily before breakfast. Daily on an empty stomach with a glass of water at least 30 to 60 minutes before meal   Yes [provider]  lisinopril (PRINIVIL,ZESTRIL) 10 MG tablet Take 10 mg by mouth daily.   Yes [provider]  Multiple Vitamin (MULTIVITAMIN) tablet Take 1 tablet by mouth daily.   Yes [provider]  Multiple Vitamins-Minerals (PRESERVISION AREDS 2 PO) Take 1 tablet by mouth in the morning and at bedtime.    Yes [provider]  Omega 3-6-9 Fatty Acids (OMEGA 3-6-9 COMPLEX PO) Take 1 capsule by mouth daily.    Yes [provider]  sildenafil (REVATIO) 20 MG tablet Take 20-40 mg by mouth daily as needed (erectile dysfunction).    Yes [provider]    Allergies Patient has no known allergies.  History reviewed. No pertinent family history.  Social History Social History   Tobacco Use   Smoking status: Former Smoker    Packs/day:  0.50    Types: Cigarettes    Quit date: 35    Years since quitting: 62.6   Smokeless tobacco: Never Used  Vaping Use   Vaping Use: Never used  Substance Use Topics   Alcohol use: No   Drug use: No    Review of Systems Constitutional: Negative for fever. Cardiovascular: Negative for chest pain. Respiratory: Negative for shortness of breath. Musculoskeletal: Positive for right hip pain. Skin: Negative for open wound.  Neurological: Negative for decrease in sensation  ____________________________________________   PHYSICAL EXAM:  VITAL SIGNS: ED Triage Vitals  Enc Vitals Group     BP 03/05/20 1608 (!) 152/84      Pulse Rate 03/05/20 1608 82     Resp 03/05/20 1608 16     Temp 03/05/20 1608 98.6 F (37 C)     Temp Source 03/05/20 1608 Oral     SpO2 03/05/20 1608 97 %     Weight 03/05/20 1613 152 lb (68.9 kg)     Height 03/05/20 1613 6' (1.829 m)     Head Circumference --      Peak Flow --      Pain Score 03/05/20 1611 0     Pain Loc --      Pain Edu? --      Excl. in [place]? --     Constitutional: Alert and oriented. Well appearing and in no acute distress. Eyes: Conjunctivae are clear without discharge or drainage Head: Atraumatic Neck: Supple. No focal midline tenderness. Respiratory: No cough. Respirations are even and unlabored. Musculoskeletal: Right upper leg and hip pain. No focal tenderness over knee or lower leg/ankle/foot. DP and PT pulse intact.  Neurologic: Awake, alert, oriented. Motor and sensory function of right lower extremity intact.  Skin: No open wounds over right hip or leg.  Psychiatric: Affect and behavior are appropriate.  ____________________________________________   LABS (all labs ordered are listed, but only abnormal results are displayed)  Labs Reviewed  CBC WITH DIFFERENTIAL/PLATELET - Abnormal; Notable for the following components:      Result Value   WBC 12.6 (*)    Neutro Abs 10.4 (*)    Abs Immature Granulocytes 0.08 (*)    All other components within normal limits  BASIC METABOLIC PANEL - Abnormal; Notable for the following components:   Glucose, Bld 113 (*)    All other components within normal limits  SARS CORONAVIRUS 2 BY RT PCR (HOSPITAL ORDER, [place] [facility])  TYPE AND SCREEN   ____________________________________________  RADIOLOGY  Right femoral neck fracture.  I, Sherrie George, personally viewed and evaluated these images (plain radiographs) as part of my medical decision making, as well as reviewing the written report by the radiologist.  DG Chest 1 View  Result Date: 03/05/2020 CLINICAL DATA:  Preop hip  fracture EXAM: CHEST  1 VIEW COMPARISON:  03/05/2020 FINDINGS: No acute consolidation or pleural effusion. Normal cardiomediastinal silhouette with aortic atherosclerosis. Prominent skin fold artifact over the left lateral chest. IMPRESSION: No active disease. Electronically Signed   By: Donavan Foil M.D.   On: 03/05/2020 18:36   DG Pelvis 1-2 Views  Result Date: 03/05/2020 CLINICAL DATA:  Right hip and distal femur pain after tripped on steps today. Unable to straighten right leg. EXAM: PELVIS - 1-2 VIEW COMPARISON:  None. FINDINGS: Mildly displaced and angulated right femoral neck fracture. The femoral head remains seated in the acetabulum. The pubic rami are intact. No additional fracture of the pelvis. 3 screws  in the left proximal femur appear intact. IMPRESSION: Mildly displaced and angulated right femoral neck fracture. Electronically Signed   By: Keith Rake M.D.   On: 03/05/2020 17:46   DG Femur Min 2 Views Right  Result Date: 03/05/2020 CLINICAL DATA:  Right hip and distal femur pain after trip down steps today. EXAM: RIGHT FEMUR 2 VIEWS COMPARISON:  None. FINDINGS: Mildly displaced and angulated femoral neck fracture. No additional fracture of the more distal femur. Knee alignment is maintained with osteoarthritis. There is a small knee joint effusion. IMPRESSION: Mildly displaced and angulated right femoral neck fracture. The distal femur is intact. Electronically Signed   By: Keith Rake M.D.   On: 03/05/2020 17:47   ____________________________________________   PROCEDURES  Procedures  ____________________________________________   INITIAL IMPRESSION / ASSESSMENT AND PLAN / ED COURSE  Robert Knight is a 85 y.o. who presents to the emergency department for treatment and evaluation of right lower extremity pain after mechanical, nonsyncopal fall at home just prior to arrival.  See HPI for further details.  Plan will be to get images of the right hip, pelvis, and femur.   The patient is in a position of comfort with his right knee flexed with pillows underneath to support.  ----------------------------------------- 6:00 PM on 03/05/2020 -----------------------------------------  Pain is increased significantly.  Morphine and Zofran have been ordered and are currently being administered.  Does have a right femoral neck fracture.  Results and plan discussed with the patient and family. Orthopedic surgeon paged.  ----------------------------------------- 8:20PM on 03/05/2020 -----------------------------------------  Patient accepted for admission by Hospitalist service. Dr. Posey Pronto with orthopedics will see him tomorrow.   Medications  morphine 2 MG/ML injection 1-2 mg (has no administration in time range)  ondansetron (ZOFRAN) injection 4 mg (has no administration in time range)  labetalol (NORMODYNE) injection 10 mg (has no administration in time range)  0.9 %  sodium chloride infusion ( Intravenous New Bag/Given 03/05/20 2059)  ceFAZolin (ANCEF) IVPB 1 g/50 mL premix (has no administration in time range)  morphine 4 MG/ML injection 4 mg (4 mg Intravenous Given 03/05/20 1800)  ondansetron (ZOFRAN) injection 4 mg (4 mg Intravenous Given 03/05/20 1800)  morphine 4 MG/ML injection 4 mg (4 mg Intravenous Given 03/05/20 1925)    Pertinent labs & imaging results that were available during my care of the patient were reviewed by me and considered in my medical decision making (see chart for details).   _________________________________________   FINAL CLINICAL IMPRESSION(S) / ED DIAGNOSES  Final diagnoses:  Acute pain of extremity  Closed fracture of right hip, initial encounter [facility])    ED Discharge Orders    None       If controlled substance prescribed during this visit, 12 month history viewed on the [facility] prior to issuing an initial prescription for Schedule II or III opiod.   Victorino Dike, FNP 03/05/20 2308    Nance Pear,  MD 03/05/20 [phone]

## 2020-03-05 NOTE — Progress Notes (Addendum)
Full consult note and discussion with patient to follow tomorrow AM.  Called by ED staff. Imaging reviewed.  - Plan for surgery tomorrow - NPO after midnight - Hold anticoagulation - Admit to Hospitalist team.

## 2020-03-05 NOTE — H&P (Addendum)
History and Physical    Robert Knight YBO:[id number] DOB: 1935/01/23 DOA: 03/05/2020  PCP: Kirk Ruths, MD   Patient coming from: Home   Chief Complaint: Fall with right hip pain   HPI: Robert Knight is a 85 y.o. male with medical history significant for mild cognitive impairment, hypertension, and hypothyroidism, now presenting to the emergency department with severe right leg pain after a fall.  Patient was visiting his son's new home, did not realize that there were some steps where he was walking, stumbled, and fell directly onto his right hip.  He denies hitting his head or losing consciousness, reports that he had been in his usual state leading up to this, but was experiencing immediate and severe pain at the proximal right leg.  Patient reports that he is usually active, exercises regularly, and can easily ascend a flight of stairs without any significant dyspnea.  He never experiences chest pain with activity.  No recent fevers, chills, cough, or shortness of breath.  ED Course: Upon arrival to the ED, patient is found to be afebrile, saturating well on room air, and with stable blood pressure.  EKG features a sinus rhythm with PVCs and chronic RBBB.  Chest x-rays negative for acute cardiopulmonary disease.  Radiographs of the hip and femur demonstrate mildly displaced angulated right femoral neck fracture.  Chemistry panel is unremarkable and CBC notable for leukocytosis to 12,600.  Orthopedic surgery was consulted by the ED physician and recommended medical admission.  The patient was treated with morphine and Zofran in the ED.  COVID-19 screening test is negative.  Review of Systems:  All other systems reviewed and apart from HPI, are negative.  Past Medical History:  Diagnosis Date  . Anosmia   . Arm fracture    right arm  . Ataxia   . Colon polyp   . ED (erectile dysfunction)    of organic origin  . Elevated PSA   . Hypertension   . Hypothyroidism   . Legally  blind in left eye, as defined in [place]   . Neuropathy    peripheral neuropathy  . Osteoarthritis   . Pneumonia 1960    Past Surgical History:  Procedure Laterality Date  . APPENDECTOMY    . CATARACT EXTRACTION  04/19/2010  . COLONOSCOPY WITH PROPOFOL N/A 01/04/2017   Procedure: COLONOSCOPY WITH PROPOFOL;  Surgeon: Lollie Sails, MD;  Location: [facility] ENDOSCOPY;  Service: Endoscopy;  Laterality: N/A;  . EYE SURGERY Left 2003   vitrectomy with TPA injection  . KNEE ARTHROSCOPY Bilateral 1973, 2000, 2002   1973 left, 2000 right, 2002 right  . left hip pin Left 1999  . LUMBAR LAMINECTOMY    . rectal cauterization  1979  . TONSILLECTOMY      Social History:   reports that he quit smoking about 62 years ago. His smoking use included cigarettes. He smoked 0.50 packs per day. He has never used smokeless tobacco. He reports that he does not drink alcohol and does not use drugs.  No Known Allergies  History reviewed. No pertinent family history.   Prior to Admission medications   Medication Sig Start Date End Date Taking? Authorizing Provider  aspirin EC 81 MG tablet Take 81 mg by mouth daily.    [provider]  Cholecalciferol 2000 units CAPS Take 700 Units by mouth daily.     [provider]  Coenzyme Q10 (CO Q-10) 100 MG CAPS Take 1 capsule by mouth daily.  [provider]  fluticasone (FLONASE) 50 MCG/ACT nasal spray Place 2 sprays into both nostrils daily.    [provider]  levothyroxine (SYNTHROID, LEVOTHROID) 75 MCG tablet Take 75 mcg by mouth daily before breakfast. Daily on an empty stomach with a glass of water at least 30 to 60 minutes before meal    [provider]  lisinopril (PRINIVIL,ZESTRIL) 10 MG tablet Take 10 mg by mouth daily.    [provider]  Multiple Vitamin (MULTIVITAMIN) tablet Take 1 tablet by mouth daily.    [provider]  Multiple Vitamins-Minerals (PRESERVISION AREDS 2 PO) Take 1 Dose by  mouth daily.    [provider]  Omega 3-6-9 Fatty Acids (OMEGA 3-6-9 COMPLEX PO) Take 3-360 mg by mouth daily.    [provider]  sildenafil (REVATIO) 20 MG tablet Take 20 mg by mouth as needed.    [provider]  tobramycin-dexamethasone [facility]) ophthalmic solution tobramycin 0.3 %-dexamethasone 0.1 % eye drops,suspension    [provider]    Physical Exam: Vitals:   03/05/20 1608 03/05/20 1613 03/05/20 1923  BP: (!) 152/84  (!) 146/66  Pulse: 82  101  Resp: 16  18  Temp: 98.6 F (37 C)    TempSrc: Oral    SpO2: 97%  100%  Weight:  68.9 kg   Height:  6' (1.829 m)     Constitutional: NAD, calm  Eyes: PERTLA, lids and conjunctivae normal ENMT: Mucous membranes are moist. Posterior pharynx clear of any exudate or lesions.   Neck: normal, supple, no masses, no thyromegaly Respiratory:  no wheezing, no crackles. No accessory muscle use.  Cardiovascular: S1 & S2 heard, regular rate and rhythm. No extremity edema.  Abdomen: No distension, no tenderness, soft. Bowel sounds active.  Musculoskeletal: Tender right hip, neurovascularly intact distally. No joint red/hot/ swollen joint.   Skin: no significant rashes, lesions, ulcers. Warm, dry, well-perfused. Neurologic: CN 2-12 grossly intact. Sensation intact. Moving all extremities.  Psychiatric: Alert and oriented to person, place, and situation. Pleasant and cooperative.    Labs and Imaging on Admission: I have personally reviewed following labs and imaging studies  CBC: Recent Labs  Lab 03/05/20 1802  WBC 12.6*  NEUTROABS 10.4*  HGB 15.2  HCT 43.1  MCV 89.8  PLT 989   Basic Metabolic Panel: Recent Labs  Lab 03/05/20 1802  NA 142  K 3.9  CL 105  CO2 23  GLUCOSE 113*  BUN 18  CREATININE 0.96  CALCIUM 9.4   GFR: Estimated Creatinine Clearance: 57.8 mL/min (by C-G formula based on SCr of 0.96 mg/dL). Liver Function Tests: No results for input(s): AST, ALT, ALKPHOS, BILITOT,  PROT, ALBUMIN in the last 168 hours. No results for input(s): LIPASE, AMYLASE in the last 168 hours. No results for input(s): AMMONIA in the last 168 hours. Coagulation Profile: No results for input(s): INR, PROTIME in the last 168 hours. Cardiac Enzymes: No results for input(s): CKTOTAL, CKMB, CKMBINDEX, TROPONINI in the last 168 hours. BNP (last 3 results) No results for input(s): PROBNP in the last 8760 hours. HbA1C: No results for input(s): HGBA1C in the last 72 hours. CBG: No results for input(s): GLUCAP in the last 168 hours. Lipid Profile: No results for input(s): CHOL, HDL, LDLCALC, TRIG, CHOLHDL, LDLDIRECT in the last 72 hours. Thyroid Function Tests: No results for input(s): TSH, T4TOTAL, FREET4, T3FREE, THYROIDAB in the last 72 hours. Anemia Panel: No results for input(s): VITAMINB12, FOLATE, FERRITIN, TIBC, IRON, RETICCTPCT in the last  72 hours. Urine analysis:    Component Value Date/Time   COLORURINE YELLOW 06/20/2017 0903   APPEARANCEUR Clear 01/23/2020 1042   LABSPEC 1.006 06/20/2017 0903   PHURINE 6.0 06/20/2017 0903   GLUCOSEU Negative 01/23/2020 1042   HGBUR NEGATIVE 06/20/2017 0903   BILIRUBINUR Negative 01/23/2020 [place] 06/20/2017 0903   PROTEINUR Negative 01/23/2020 1042   PROTEINUR NEGATIVE 06/20/2017 0903   NITRITE Negative 01/23/2020 1042   NITRITE NEGATIVE 06/20/2017 0903   LEUKOCYTESUR Negative 01/23/2020 1042   Sepsis Labs: @LABRCNTIP (procalcitonin:4,lacticidven:4) ) Recent Results (from the past 240 hour(s))  SARS Coronavirus 2 by RT PCR (hospital order, performed in [facility] hospital lab) Nasopharyngeal Nasopharyngeal Swab     Status: None   Collection Time: 03/05/20  6:02 PM   Specimen: Nasopharyngeal Swab  Result Value Ref Range Status   SARS Coronavirus 2 NEGATIVE NEGATIVE Final    Comment: (NOTE) SARS-CoV-2 target nucleic acids are NOT DETECTED.  The SARS-CoV-2 RNA is generally detectable in upper and  lower respiratory specimens during the acute phase of infection. The lowest concentration of SARS-CoV-2 viral copies this assay can detect is 250 copies / mL. A negative result does not preclude SARS-CoV-2 infection and should not be used as the sole basis for treatment or other patient management decisions.  A negative result may occur with improper specimen collection / handling, submission of specimen other than nasopharyngeal swab, presence of viral mutation(s) within the areas targeted by this assay, and inadequate number of viral copies (<250 copies / mL). A negative result must be combined with clinical observations, patient history, and epidemiological information.  Fact Sheet for Patients:   StrictlyIdeas.no  Fact Sheet for Healthcare Providers: BankingDealers.co.za  This test is not yet approved or  cleared by the [place] FDA and has been authorized for detection and/or diagnosis of SARS-CoV-2 by FDA under an Emergency Use Authorization (EUA).  This EUA will remain in effect (meaning this test can be used) for the duration of the COVID-19 declaration under Section 564(b)(1) of the Act, 21 U.S.C. section 360bbb-3(b)(1), unless the authorization is terminated or revoked sooner.  Performed at [facility], [address], [place], [place] [address]      Radiological Exams on Admission: DG Chest 1 View  Result Date: 03/05/2020 CLINICAL DATA:  Preop hip fracture EXAM: CHEST  1 VIEW COMPARISON:  03/05/2020 FINDINGS: No acute consolidation or pleural effusion. Normal cardiomediastinal silhouette with aortic atherosclerosis. Prominent skin fold artifact over the left lateral chest. IMPRESSION: No active disease. Electronically Signed   By: Donavan Foil M.D.   On: 03/05/2020 18:36   DG Pelvis 1-2 Views  Result Date: 03/05/2020 CLINICAL DATA:  Right hip and distal femur pain after tripped on steps today. Unable to  straighten right leg. EXAM: PELVIS - 1-2 VIEW COMPARISON:  None. FINDINGS: Mildly displaced and angulated right femoral neck fracture. The femoral head remains seated in the acetabulum. The pubic rami are intact. No additional fracture of the pelvis. 3 screws in the left proximal femur appear intact. IMPRESSION: Mildly displaced and angulated right femoral neck fracture. Electronically Signed   By: Keith Rake M.D.   On: 03/05/2020 17:46   DG Femur Min 2 Views Right  Result Date: 03/05/2020 CLINICAL DATA:  Right hip and distal femur pain after trip down steps today. EXAM: RIGHT FEMUR 2 VIEWS COMPARISON:  None. FINDINGS: Mildly displaced and angulated femoral neck fracture. No additional fracture of the more distal femur. Knee alignment is maintained  with osteoarthritis. There is a small knee joint effusion. IMPRESSION: Mildly displaced and angulated right femoral neck fracture. The distal femur is intact. Electronically Signed   By: Keith Rake M.D.   On: 03/05/2020 17:47    EKG: Independently reviewed. Sinus rhythm, PVCs, chronic RBBB.   Assessment/Plan   1. Right hip fracture  - Presents with severe right leg pain after a mechanical fall and is found to have right femoral neck fracture  - Orthopedic surgery consulting and much appreciated  - Based on the available data, Mr. Bozzo presents an estimated 0.25% risk of perioperative MI or cardiac arrest  - Continue CMS checks, pain-control, hold ASA and ACE-inhibitor    2. Hypertension  - Hold lisinopril prior to surgery, treat as-needed with labetalol for now    3. Hypothyroidism  - Continue Synthroid   4. Mild cognitive impairment  - Follows with neurology for this, is at risk of developing delirium while in hospital  - Delirium precautions    DVT prophylaxis: SCDs  Code Status: Full  Family Communication: Wife updated at bedside Disposition Plan:  Patient is from: Home  Anticipated d/c is to: TBD Anticipated d/c date is:  03/08/20 Patient currently: Pending surgical consultation and likely operative repair of hip fracture  Consults called: Orthopedic surgery consulted by ED physician  Admission status: Inpatient     Vianne Bulls, MD [facility]  03/05/2020, 7:32 PM

## 2020-03-05 NOTE — ED Triage Notes (Signed)
Per EMS report, patient had a mechanical fall from two brick steps to the garage. Patient denies LOC or head injury. Patient is not on blood thinners. Patient c/o right anterior leg pain from hip to just below knee. Patient has leg bent with a pillow under his knee which is a position of comfort. Patient denies pain unless moving the right leg.

## 2020-03-05 NOTE — ED Provider Notes (Signed)
Apolonio Schneiders, attending physician, personally viewed and interpreted this EKG  EKG Time: 1759 Rate: 90 Rhythm: sinus rhythm with pac Axis: normal Intervals: qtc 486 QRS: RBBB ST changes: no st elevation Impression: abnormal Val Eagle, MD 03/05/20 [id number]

## 2020-03-06 ENCOUNTER — Encounter
Admission: EM | Disposition: A | Discharge status: Home Health | Payer: Self-pay | Source: Home / Self Care | Attending: Internal Medicine

## 2020-03-06 ENCOUNTER — Inpatient Hospital Stay: Payer: Medicare HMO

## 2020-03-06 ENCOUNTER — Encounter: Payer: Self-pay | Admitting: Orthopedic Surgery

## 2020-03-06 ENCOUNTER — Inpatient Hospital Stay: Payer: Medicare HMO | Admitting: Anesthesiology

## 2020-03-06 DIAGNOSIS — R739 Hyperglycemia, unspecified: Secondary | ICD-10-CM

## 2020-03-06 HISTORY — PX: HIP ARTHROPLASTY: SHX981

## 2020-03-06 LAB — CBC
HCT: 39.6 % (ref 39.0–52.0)
Hemoglobin: 13 g/dL (ref 13.0–17.0)
MCH: 30.4 pg (ref 26.0–34.0)
MCHC: 32.8 g/dL (ref 30.0–36.0)
MCV: 92.5 fL (ref 80.0–100.0)
Platelets: 155 10*3/uL (ref 150–400)
RBC: 4.28 MIL/uL (ref 4.22–5.81)
RDW: 13.2 % (ref 11.5–15.5)
WBC: 9.3 10*3/uL (ref 4.0–10.5)
nRBC: 0 % (ref 0.0–0.2)

## 2020-03-06 LAB — GLUCOSE, CAPILLARY: Glucose-Capillary: 92 mg/dL (ref 70–99)

## 2020-03-06 LAB — BASIC METABOLIC PANEL
Anion gap: 9 (ref 5–15)
BUN: 22 mg/dL (ref 8–23)
CO2: 26 mmol/L (ref 22–32)
Calcium: 8.4 mg/dL — ABNORMAL LOW (ref 8.9–10.3)
Chloride: 108 mmol/L (ref 98–111)
Creatinine, Ser: 1.21 mg/dL (ref 0.61–1.24)
GFR calc Af Amer: >60 mL/min (ref >60)
GFR calc non Af Amer: 55 mL/min — ABNORMAL LOW (ref >60)
Glucose, Bld: 130 mg/dL — ABNORMAL HIGH (ref 70–99)
Potassium: 4 mmol/L (ref 3.5–5.1)
Sodium: 143 mmol/L (ref 135–145)

## 2020-03-06 SURGERY — HEMIARTHROPLASTY, HIP, DIRECT ANTERIOR APPROACH, FOR FRACTURE
Anesthesia: Spinal | Site: Hip | Laterality: Right

## 2020-03-06 MED ORDER — OXYCODONE HCL 5 MG PO TABS: 5.0000 mg | ORAL_TABLET | Freq: Once | ORAL | Status: DC | PRN

## 2020-03-06 MED ORDER — METOCLOPRAMIDE HCL 5 MG/ML IJ SOLN: 5.0000 mg | Freq: Three times a day (TID) | INTRAMUSCULAR | Status: DC | PRN

## 2020-03-06 MED ORDER — TRANEXAMIC ACID-NACL 1000-0.7 MG/100ML-% IV SOLN
INTRAVENOUS | Status: AC
  Filled 2020-03-06: qty 100

## 2020-03-06 MED ORDER — PROPOFOL 10 MG/ML IV BOLUS
INTRAVENOUS | Status: AC
  Filled 2020-03-06: qty 20

## 2020-03-06 MED ORDER — PROPOFOL 10 MG/ML IV BOLUS
INTRAVENOUS | Status: DC | PRN
  Administered 2020-03-06 (×2): 20 mg via INTRAVENOUS

## 2020-03-06 MED ORDER — SENNOSIDES-DOCUSATE SODIUM 8.6-50 MG PO TABS: 1.0000 | ORAL_TABLET | Freq: Every evening | ORAL | Status: DC | PRN

## 2020-03-06 MED ORDER — BISACODYL 10 MG RE SUPP: 10.0000 mg | Freq: Every day | RECTAL | Status: DC | PRN

## 2020-03-06 MED ORDER — CHLORHEXIDINE GLUCONATE CLOTH 2 % EX PADS
6.0000 | MEDICATED_PAD | Freq: Every day | CUTANEOUS | Status: DC
  Administered 2020-03-06: 6 via TOPICAL

## 2020-03-06 MED ORDER — MIDAZOLAM HCL 5 MG/5ML IJ SOLN
INTRAMUSCULAR | Status: DC | PRN
  Administered 2020-03-06: 1 mg via INTRAVENOUS

## 2020-03-06 MED ORDER — BUPIVACAINE LIPOSOME 1.3 % IJ SUSP
INTRAMUSCULAR | Status: DC | PRN
  Administered 2020-03-06: 50 mL

## 2020-03-06 MED ORDER — OXYCODONE HCL 5 MG/5ML PO SOLN: 5.0000 mg | Freq: Once | ORAL | Status: DC | PRN

## 2020-03-06 MED ORDER — TRANEXAMIC ACID-NACL 1000-0.7 MG/100ML-% IV SOLN
INTRAVENOUS | Status: DC | PRN
  Administered 2020-03-06: 1000 mg via INTRAVENOUS

## 2020-03-06 MED ORDER — ONDANSETRON HCL 4 MG/2ML IJ SOLN: 4.0000 mg | Freq: Once | INTRAMUSCULAR | Status: DC | PRN

## 2020-03-06 MED ORDER — CEFAZOLIN SODIUM-DEXTROSE 2-3 GM-%(50ML) IV SOLR
INTRAVENOUS | Status: DC | PRN
  Administered 2020-03-06: 2 g via INTRAVENOUS

## 2020-03-06 MED ORDER — ADULT MULTIVITAMIN W/MINERALS CH
1.0000 | ORAL_TABLET | Freq: Every day | ORAL | Status: DC
  Administered 2020-03-07 – 2020-03-08 (×2): 1 via ORAL
  Filled 2020-03-06 (×2): qty 1

## 2020-03-06 MED ORDER — FENTANYL CITRATE (PF) 100 MCG/2ML IJ SOLN: 25.0000 ug | INTRAMUSCULAR | Status: DC | PRN

## 2020-03-06 MED ORDER — MENTHOL 3 MG MT LOZG
1.0000 | LOZENGE | OROMUCOSAL | Status: DC | PRN
  Filled 2020-03-06: qty 9

## 2020-03-06 MED ORDER — OXYCODONE HCL 5 MG PO TABS: 5.0000 mg | ORAL_TABLET | ORAL | Status: DC | PRN

## 2020-03-06 MED ORDER — KETAMINE HCL 10 MG/ML IJ SOLN
INTRAMUSCULAR | Status: DC | PRN
  Administered 2020-03-06: 30 mg via INTRAVENOUS

## 2020-03-06 MED ORDER — METOCLOPRAMIDE HCL 10 MG PO TABS: 5.0000 mg | ORAL_TABLET | Freq: Three times a day (TID) | ORAL | Status: DC | PRN

## 2020-03-06 MED ORDER — KETAMINE HCL 50 MG/ML IJ SOLN
INTRAMUSCULAR | Status: AC
  Filled 2020-03-06: qty 10

## 2020-03-06 MED ORDER — ONDANSETRON HCL 4 MG/2ML IJ SOLN: 4.0000 mg | Freq: Four times a day (QID) | INTRAMUSCULAR | Status: DC | PRN

## 2020-03-06 MED ORDER — JUVEN PO PACK
1.0000 | PACK | Freq: Two times a day (BID) | ORAL | Status: DC
  Administered 2020-03-07: 1 via ORAL

## 2020-03-06 MED ORDER — LEVOTHYROXINE SODIUM 25 MCG PO TABS
75.0000 ug | ORAL_TABLET | Freq: Every day | ORAL | Status: DC
  Administered 2020-03-07 – 2020-03-08 (×2): 75 ug via ORAL
  Filled 2020-03-06 (×2): qty 3

## 2020-03-06 MED ORDER — SODIUM CHLORIDE 0.9 % IR SOLN
Status: DC | PRN
  Administered 2020-03-06: 200 mL

## 2020-03-06 MED ORDER — TRANEXAMIC ACID-NACL 1000-0.7 MG/100ML-% IV SOLN
1000.0000 mg | Freq: Once | INTRAVENOUS | Status: AC
  Administered 2020-03-06: 1000 mg via INTRAVENOUS

## 2020-03-06 MED ORDER — MIDAZOLAM HCL 2 MG/2ML IJ SOLN
INTRAMUSCULAR | Status: AC
  Filled 2020-03-06: qty 2

## 2020-03-06 MED ORDER — CEFAZOLIN SODIUM-DEXTROSE 1-4 GM/50ML-% IV SOLN
1.0000 g | Freq: Four times a day (QID) | INTRAVENOUS | Status: AC
  Administered 2020-03-06 – 2020-03-07 (×2): 1 g via INTRAVENOUS
  Filled 2020-03-06 (×2): qty 50

## 2020-03-06 MED ORDER — SODIUM CHLORIDE 0.9 % IV SOLN: INTRAVENOUS | Status: DC

## 2020-03-06 MED ORDER — TRAMADOL HCL 50 MG PO TABS
50.0000 mg | ORAL_TABLET | Freq: Four times a day (QID) | ORAL | Status: DC | PRN
  Administered 2020-03-07: 50 mg via ORAL
  Filled 2020-03-06: qty 1

## 2020-03-06 MED ORDER — DOCUSATE SODIUM 100 MG PO CAPS
100.0000 mg | ORAL_CAPSULE | Freq: Two times a day (BID) | ORAL | Status: DC
  Administered 2020-03-06 – 2020-03-08 (×4): 100 mg via ORAL
  Filled 2020-03-06 (×4): qty 1

## 2020-03-06 MED ORDER — ENSURE ENLIVE PO LIQD
237.0000 mL | Freq: Two times a day (BID) | ORAL | Status: DC
  Administered 2020-03-07: 237 mL via ORAL

## 2020-03-06 MED ORDER — CEFAZOLIN SODIUM 1 G IJ SOLR
INTRAMUSCULAR | Status: AC
  Filled 2020-03-06: qty 20

## 2020-03-06 MED ORDER — ENOXAPARIN SODIUM 40 MG/0.4ML ~~LOC~~ SOLN
40.0000 mg | SUBCUTANEOUS | Status: DC
  Administered 2020-03-07 – 2020-03-08 (×2): 40 mg via SUBCUTANEOUS
  Filled 2020-03-06 (×2): qty 0.4

## 2020-03-06 MED ORDER — LACTATED RINGERS IV SOLN
INTRAVENOUS | Status: DC | PRN
Start: 2020-03-06 — End: 2020-03-06

## 2020-03-06 MED ORDER — OXYCODONE HCL 5 MG PO TABS
2.5000 mg | ORAL_TABLET | ORAL | Status: DC | PRN
  Administered 2020-03-07 (×2): 5 mg via ORAL
  Filled 2020-03-06 (×2): qty 1

## 2020-03-06 MED ORDER — HYDROMORPHONE HCL 1 MG/ML IJ SOLN: 0.2000 mg | INTRAMUSCULAR | Status: DC | PRN

## 2020-03-06 MED ORDER — PHENOL 1.4 % MT LIQD
1.0000 | OROMUCOSAL | Status: DC | PRN
  Filled 2020-03-06: qty 177

## 2020-03-06 MED ORDER — ONDANSETRON HCL 4 MG PO TABS: 4.0000 mg | ORAL_TABLET | Freq: Four times a day (QID) | ORAL | Status: DC | PRN

## 2020-03-06 MED ORDER — EPHEDRINE SULFATE 50 MG/ML IJ SOLN
INTRAMUSCULAR | Status: DC | PRN
  Administered 2020-03-06: 10 mg via INTRAVENOUS

## 2020-03-06 MED ORDER — EPHEDRINE 5 MG/ML INJ
INTRAVENOUS | Status: AC
  Filled 2020-03-06: qty 10

## 2020-03-06 MED ORDER — PROPOFOL 500 MG/50ML IV EMUL
INTRAVENOUS | Status: DC | PRN
  Administered 2020-03-06: 50 ug/kg/min via INTRAVENOUS

## 2020-03-06 MED ORDER — ACETAMINOPHEN 500 MG PO TABS
1000.0000 mg | ORAL_TABLET | Freq: Three times a day (TID) | ORAL | Status: AC
  Administered 2020-03-06 – 2020-03-07 (×4): 1000 mg via ORAL
  Filled 2020-03-06 (×4): qty 2

## 2020-03-06 MED ORDER — BUPIVACAINE HCL (PF) 0.5 % IJ SOLN
INTRAMUSCULAR | Status: DC | PRN
  Administered 2020-03-06: 3 mL

## 2020-03-06 SURGICAL SUPPLY — 75 items
ADH SKN CLS APL DERMABOND .7 (GAUZE/BANDAGES/DRESSINGS) ×1
APL PRP STRL LF DISP 70% ISPRP (MISCELLANEOUS) ×1
BLADE SAW SGTL 13X75X1.27 (BLADE) ×2 IMPLANT
BLADE SURG SZ10 CARB STEEL (BLADE) ×2 IMPLANT
BNDG COHESIVE 4X5 TAN STRL (GAUZE/BANDAGES/DRESSINGS) ×2 IMPLANT
CANISTER SUCT 1200ML W/VALVE (MISCELLANEOUS) ×1 IMPLANT
CANISTER SUCT 3000ML PPV (MISCELLANEOUS) ×3 IMPLANT
CHLORAPREP W/TINT 26 (MISCELLANEOUS) ×2 IMPLANT
COVER BACK TABLE REUSABLE LG (DRAPES) ×2 IMPLANT
COVER MAYO STAND STRL (DRAPES) ×2 IMPLANT
COVER WAND RF STERILE (DRAPES) ×2 IMPLANT
DERMABOND ADVANCED (GAUZE/BANDAGES/DRESSINGS) ×1
DERMABOND ADVANCED .7 DNX12 (GAUZE/BANDAGES/DRESSINGS) ×1 IMPLANT
DRAPE 3/4 80X56 (DRAPES) ×6 IMPLANT
DRAPE INCISE IOBAN 66X60 STRL (DRAPES) ×2 IMPLANT
DRAPE SPLIT 6X30 W/TAPE (DRAPES) ×2 IMPLANT
DRAPE SURG 17X11 SM STRL (DRAPES) ×2 IMPLANT
DRAPE U-SHAPE 47X51 STRL (DRAPES) ×2 IMPLANT
DRSG OPSITE POSTOP 4X10 (GAUZE/BANDAGES/DRESSINGS) ×2 IMPLANT
DRSG OPSITE POSTOP 4X8 (GAUZE/BANDAGES/DRESSINGS) ×2 IMPLANT
ELECT BLADE 6.5 EXT (BLADE) ×2 IMPLANT
ELECT CAUTERY BLADE 6.4 (BLADE) ×2 IMPLANT
ELECT REM PT RETURN 9FT ADLT (ELECTROSURGICAL) ×2
ELECTRODE REM PT RTRN 9FT ADLT (ELECTROSURGICAL) ×1 IMPLANT
GAUZE SPONGE 4X4 12PLY STRL (GAUZE/BANDAGES/DRESSINGS) ×2 IMPLANT
GAUZE XEROFORM 1X8 LF (GAUZE/BANDAGES/DRESSINGS) ×2 IMPLANT
GLOVE BIOGEL PI IND STRL 8 (GLOVE) ×2 IMPLANT
GLOVE BIOGEL PI INDICATOR 8 (GLOVE) ×2
GLOVE SURG ORTHO 8.0 STRL STRW (GLOVE) ×4 IMPLANT
GOWN STRL REUS W/ TWL LRG LVL3 (GOWN DISPOSABLE) ×1 IMPLANT
GOWN STRL REUS W/ TWL XL LVL3 (GOWN DISPOSABLE) ×1 IMPLANT
GOWN STRL REUS W/TWL LRG LVL3 (GOWN DISPOSABLE) ×2
GOWN STRL REUS W/TWL XL LVL3 (GOWN DISPOSABLE) ×2
HEAD MODULAR ENDO (Orthopedic Implant) ×2 IMPLANT
HEAD UNPLR 55XMDLR STRL HIP (Orthopedic Implant) IMPLANT
HEMOVAC 400ML (MISCELLANEOUS)
KIT DRAIN HEMOVAC JP 7FR 400ML (MISCELLANEOUS) IMPLANT
KIT TURNOVER KIT A (KITS) ×2 IMPLANT
NDL FILTER BLUNT 18X1 1/2 (NEEDLE) ×1 IMPLANT
NDL MAYO CATGUT SZ4 TPR NDL (NEEDLE) ×1 IMPLANT
NDL SAFETY ECLIPSE 18X1.5 (NEEDLE) ×1 IMPLANT
NEEDLE FILTER BLUNT 18X 1/2SAF (NEEDLE) ×1
NEEDLE FILTER BLUNT 18X1 1/2 (NEEDLE) ×1 IMPLANT
NEEDLE HYPO 18GX1.5 SHARP (NEEDLE) ×2
NEEDLE HYPO 22GX1.5 SAFETY (NEEDLE) ×2 IMPLANT
NEEDLE MAYO CATGUT SZ4 (NEEDLE) ×2 IMPLANT
NEPTUNE MANIFOLD (MISCELLANEOUS) ×2 IMPLANT
NS IRRIG 1000ML POUR BTL (IV SOLUTION) ×2 IMPLANT
PACK HIP PROSTHESIS (MISCELLANEOUS) ×2 IMPLANT
PENCIL SMOKE ULTRAEVAC 22 CON (MISCELLANEOUS) ×2 IMPLANT
PILLOW ABDUCTION FOAM SM (MISCELLANEOUS) ×2 IMPLANT
PILLOW ABDUCTION MEDIUM (MISCELLANEOUS) ×1 IMPLANT
PULSAVAC PLUS IRRIG FAN TIP (DISPOSABLE) ×2
RETRIEVER SUT HEWSON (MISCELLANEOUS) ×1 IMPLANT
SLEEVE UNITRAX V40 (Orthopedic Implant) ×2 IMPLANT
SLEEVE UNITRAX V40 +4 (Orthopedic Implant) IMPLANT
SOL .9 NS 3000ML IRR  AL (IV SOLUTION) ×1
SOL .9 NS 3000ML IRR AL (IV SOLUTION) ×1
SOL .9 NS 3000ML IRR UROMATIC (IV SOLUTION) ×1 IMPLANT
STAPLER SKIN PROX 35W (STAPLE) ×2 IMPLANT
STEM 37MM HIP (Hips) ×1 IMPLANT
SUT ETHIBOND #5 BRAIDED 30INL (SUTURE) ×2 IMPLANT
SUT MNCRL 4-0 (SUTURE) ×2
SUT MNCRL 4-0 27XMFL (SUTURE) ×1
SUT VIC AB 0 CT1 36 (SUTURE) ×3 IMPLANT
SUT VIC AB 2-0 CT2 27 (SUTURE) ×4 IMPLANT
SUTURE MNCRL 4-0 27XMF (SUTURE) ×1 IMPLANT
SYR 20ML LL LF (SYRINGE) ×2 IMPLANT
SYR 50ML LL SCALE MARK (SYRINGE) ×2 IMPLANT
TAPE MICROFOAM 4IN (TAPE) ×2 IMPLANT
TAPE TRANSPORE STRL 2 31045 (GAUZE/BANDAGES/DRESSINGS) ×2 IMPLANT
TIP BRUSH PULSAVAC PLUS 24.33 (MISCELLANEOUS) ×1 IMPLANT
TIP FAN IRRIG PULSAVAC PLUS (DISPOSABLE) ×1 IMPLANT
TUBE KAMVAC SUCTION (TUBING) ×1 IMPLANT
TUBE SUCT KAM VAC (TUBING) ×2 IMPLANT

## 2020-03-06 NOTE — Consult Note (Signed)
ORTHOPAEDIC CONSULTATION  REQUESTING PHYSICIAN: Wyvonnia Dusky, MD  Chief Complaint:   L hip pain  History of Present Illness: Robert Knight is a 85 y.o. male who had a fall earlier yesterday after tripping on stairs at his son's new house.  The patient noted immediate right hip pain and inability to ambulate.  The patient ambulates unassisted at baseline.  He is relatively active for his age and exercises regularly.  He lives at home with his wife.  His son just moved locally yesterday.  His pain is described as sharp at its worst and a dull ache at its best.  Pain is rated a 10 out of 10 in severity.  Pain is improved with rest and immobilization.  Pain is worse with any sort of movement.  X-rays in the emergency department show a right displaced femoral neck fracture.  Past Medical History:  Diagnosis Date  . Anosmia   . Arm fracture    right arm  . Ataxia   . Colon polyp   . ED (erectile dysfunction)    of organic origin  . Elevated PSA   . Hypertension   . Hypothyroidism   . Legally blind in left eye, as defined in [place]   . Neuropathy    peripheral neuropathy  . Osteoarthritis   . Pneumonia 1960   Past Surgical History:  Procedure Laterality Date  . APPENDECTOMY    . CATARACT EXTRACTION  04/19/2010  . COLONOSCOPY WITH PROPOFOL N/A 01/04/2017   Procedure: COLONOSCOPY WITH PROPOFOL;  Surgeon: Lollie Sails, MD;  Location: [facility] ENDOSCOPY;  Service: Endoscopy;  Laterality: N/A;  . EYE SURGERY Left 2003   vitrectomy with TPA injection  . KNEE ARTHROSCOPY Bilateral 1973, 2000, 2002   1973 left, 2000 right, 2002 right  . left hip pin Left 1999  . LUMBAR LAMINECTOMY    . rectal cauterization  1979  . TONSILLECTOMY     Social History   Socioeconomic History  . Marital status: Married    Spouse name: Not on file  . Number of children: 4  . Years of education: College  . Highest education level: Not  on file  Occupational History  . Occupation: Retired  Tobacco Use  . Smoking status: Former Smoker    Packs/day: 0.50    Types: Cigarettes    Quit date: 1959    Years since quitting: 62.6  . Smokeless tobacco: Never Used  Vaping Use  . Vaping Use: Never used  Substance and Sexual Activity  . Alcohol use: No  . Drug use: No  . Sexual activity: Not on file  Other Topics Concern  . Not on file  Social History Narrative   Right handed    Lives with wife   Caffeine use: 1-2 cups per week.   Social Determinants of Health   Financial Resource Strain:   . Difficulty of Paying Living Expenses:   Food Insecurity:   . Worried About Charity fundraiser in the Last Year:   . Arboriculturist in the Last Year:   Transportation Needs:   . Film/video editor (Medical):   Marland Kitchen Lack of Transportation (Non-Medical):   Physical Activity:   . Days of Exercise per Week:   . Minutes of Exercise per Session:   Stress:   . Feeling of Stress :   Social Connections:   . Frequency of Communication with Friends and Family:   . Frequency of Social Gatherings with Friends and Family:   .  Attends Religious Services:   . Active Member of Clubs or Organizations:   . Attends Archivist Meetings:   Marland Kitchen Marital Status:    History reviewed. No pertinent family history. No Known Allergies Prior to Admission medications   Medication Sig Start Date End Date Taking? Authorizing Provider  aspirin EC 81 MG tablet Take 81 mg by mouth daily.   Yes [provider]  Cholecalciferol 2000 units CAPS Take 2,000 Units by mouth daily.    Yes [provider]  Coenzyme Q10 (CO Q-10) 30 MG CAPS Take 30 mg by mouth daily.    Yes [provider]  fluticasone (FLONASE) 50 MCG/ACT nasal spray Place 1 spray into both nostrils 2 (two) times daily as needed for allergies or rhinitis.    Yes [provider]  levothyroxine (SYNTHROID, LEVOTHROID) 75 MCG tablet Take 75 mcg by mouth  daily before breakfast. Daily on an empty stomach with a glass of water at least 30 to 60 minutes before meal   Yes [provider]  lisinopril (PRINIVIL,ZESTRIL) 10 MG tablet Take 10 mg by mouth daily.   Yes [provider]  Multiple Vitamin (MULTIVITAMIN) tablet Take 1 tablet by mouth daily.   Yes [provider]  Multiple Vitamins-Minerals (PRESERVISION AREDS 2 PO) Take 1 tablet by mouth in the morning and at bedtime.    Yes [provider]  Omega 3-6-9 Fatty Acids (OMEGA 3-6-9 COMPLEX PO) Take 1 capsule by mouth daily.    Yes [provider]  sildenafil (REVATIO) 20 MG tablet Take 20-40 mg by mouth daily as needed (erectile dysfunction).    Yes [provider]   Recent Labs    03/05/20 1802 03/06/20 0834  WBC 12.6* 9.3  HGB 15.2 13.0  HCT 43.1 39.6  PLT 207 155  K 3.9 4.0  CL 105 108  CO2 23 26  BUN 18 22  CREATININE 0.96 1.21  GLUCOSE 113* 130*  CALCIUM 9.4 8.4*   DG Chest 1 View  Result Date: 03/05/2020 CLINICAL DATA:  Preop hip fracture EXAM: CHEST  1 VIEW COMPARISON:  03/05/2020 FINDINGS: No acute consolidation or pleural effusion. Normal cardiomediastinal silhouette with aortic atherosclerosis. Prominent skin fold artifact over the left lateral chest. IMPRESSION: No active disease. Electronically Signed   By: Donavan Foil M.D.   On: 03/05/2020 18:36   DG Pelvis 1-2 Views  Result Date: 03/05/2020 CLINICAL DATA:  Right hip and distal femur pain after tripped on steps today. Unable to straighten right leg. EXAM: PELVIS - 1-2 VIEW COMPARISON:  None. FINDINGS: Mildly displaced and angulated right femoral neck fracture. The femoral head remains seated in the acetabulum. The pubic rami are intact. No additional fracture of the pelvis. 3 screws in the left proximal femur appear intact. IMPRESSION: Mildly displaced and angulated right femoral neck fracture. Electronically Signed   By: Keith Rake M.D.   On: 03/05/2020 17:46    DG Femur Min 2 Views Right  Result Date: 03/05/2020 CLINICAL DATA:  Right hip and distal femur pain after trip down steps today. EXAM: RIGHT FEMUR 2 VIEWS COMPARISON:  None. FINDINGS: Mildly displaced and angulated femoral neck fracture. No additional fracture of the more distal femur. Knee alignment is maintained with osteoarthritis. There is a small knee joint effusion. IMPRESSION: Mildly displaced and angulated right femoral neck fracture. The distal femur is intact. Electronically Signed   By: Keith Rake M.D.   On: 03/05/2020 17:47     Positive ROS: All other  systems have been reviewed and were otherwise negative with the exception of those mentioned in the HPI and as above.  Physical Exam: BP (!) 131/76 (BP Location: Left Arm)   Pulse 75   Temp (!) 97.5 F (36.4 C) (Oral)   Resp 16   Ht 6' (1.829 m)   Wt 68.9 kg   SpO2 100%   BMI 20.61 kg/m  General:  Alert, no acute distress Psychiatric:  Patient is competent for consent with normal mood and affect   Cardiovascular:  No pedal edema, regular rate and rhythm Respiratory:  No wheezing, non-labored breathing GI:  Abdomen is soft and non-tender Skin:  No lesions in the area of chief complaint, no erythema Neurologic:  Sensation intact distally, CN grossly intact Lymphatic:  No axillary or cervical lymphadenopathy  Orthopedic Exam:  LLE: + DF/PF/EHL SILT grossly over foot Foot wwp +Log roll/axial load   X-rays:  As above: Right displaced femoral neck fracture  Assessment/Plan: Robert Knight is a 85 y.o. male with a right displaced femoral neck fracture   1. I discussed the various treatment options including both surgical and non-surgical management of the fracture with the patient and his wife. We discussed the high risk of perioperative complications due to patient's age and other co-morbidities. After discussion of risks, benefits, and alternatives to surgery, the family and patient were in agreement to proceed  with surgery. The goals of surgery would be to provide adequate pain relief and allow for mobilization. Plan for surgery is right hip hemiarthroplasty today, 03/06/2020. 2. NPO until OR 3. Hold anticoagulation in advance of OR          Leim Fabry   03/06/2020 11:40 AM

## 2020-03-06 NOTE — Transfer of Care (Signed)
Immediate Anesthesia Transfer of Care Note  Patient: Jamal Collin Booher  Procedure(s) Performed: ARTHROPLASTY BIPOLAR HIP (HEMIARTHROPLASTY) (Right Hip)  Patient Location: PACU  Anesthesia Type:MAC and Spinal  Level of Consciousness: drowsy and patient cooperative  Airway & Oxygen Therapy: Patient Spontanous Breathing and Patient connected to nasal cannula oxygen  Post-op Assessment: Report given to RN and Post -op Vital signs reviewed and stable  Post vital signs: Reviewed and stable  Last Vitals:  Vitals Value Taken Time  BP 135/109 03/06/20 1441  Temp 64f  Pulse 67 03/06/20 1441  Resp 13 03/06/20 1441  SpO2 71 % 03/06/20 1441  Vitals shown include unvalidated device data.  Last Pain:  Vitals:   03/06/20 1109  TempSrc: Oral  PainSc:          Complications: No complications documented.

## 2020-03-06 NOTE — Op Note (Signed)
DATE OF SURGERY: 03/06/2020  PREOPERATIVE DIAGNOSIS: Right femoral neck fracture  POSTOPERATIVE DIAGNOSIS: Right femoral neck fracture  PROCEDURE: Right hip hemiarthroplasty  SURGEON: Cato Mulligan, MD  ANESTHESIA: spinal  EBL: 150 cc  COMPONENTS:  Stryker - Accolade II Size 7 Stem Stryker - Unitrax 87mm head with +4 offset neck   INDICATIONS: Robert Knight is a 85 y.o. male who sustained a displaced femoral neck fracture after a fall. Risks and benefits of hip hemiarthroplasty were explained to the patient and/or family. Risks include but are not limited to bleeding, infection, injury to tissues, nerves, vessels, periprosthetic infection, dislocation, limb length discrepancy and risks of anesthesia. The patient and/or family understands these risks, has completed an informed consent and wishes to proceed.   PROCEDURE:  The patient was identified in the preoperative holding area and the operative extremity was marked.  The patient was then transferred to the operating room suite and mobilized from the hospital gurney to the operating room table. Anesthesia was administered without complication. The patient was then transitioned to a lateral position.  All bony prominences were padded per protocol.  An axillary roll was placed.  Careful attention was paid to the contralateral side peroneal nerve, which was free from pressure with use of appropriate padding and blankets. A time-out was performed to confirm the patient's identity and the correct laterality of surgery. The patient was then prepped and draped in the usual sterile fashion. Appropriate pre-operative antibiotics were administered. Tranexamic acid was administered preoperatively.    An incision that centered on the posterior tip of the greater trochanter with a posterior curve was made. Dissection was carried down through the subcutaneous tissue.  Careful attention was made to maintain hemostasis using electrocautery.  Dissection  brought [place] to the level of the deep fascia where the gluteus maximus muscle and proximal portion of the IT band were identified.  The proximal region of the IT band was incised in linear fashion and this incision was extended proximally in a curvilinear fashion to split the gluteus maximus muscle parallel to its fibers to minimize bleeding.  This was accomplished using a combination of bovie electrocautery as well as blunt dissection.  The trochanteric bursa was then visualized and dissected from anterior to posterior. A blunt homan retractor was placed beneath the abductors. The piriformis tendon and short external rotators were visualized. Bovie electrocautery was used to cut these with the capsule as one L-shaped flap. This was tagged at the corner with #5 Ethibond. At this point, the femoral neck fracture was visualized. An oscillating saw was used to make a new neck cut approximately 70mm above the lesser tuberosity with the use of a neck cut guide. The head was then freed from its remaining soft tissue attachments and measured. The head trial was then inserted into the acetabulum and the appropriate sized head was selected.    We then turned our attention to preparing the femoral canal. First, a box cut was performed utilizing the box osteotome. A canal finder was inserted by hand and sequential broaching was then performed. The calcar planer was inserted onto the broach and used to smooth the calcar appropriately.  A trial stem, neck, and head were inserted into the acetabulum and placed through range of motion. Intraoperative radiographs were obtained to assess component position and leg length. The hip was again dislocated and the femoral trial components were removed.  The actual stem was inserted into the femoral canal and then driven onto the calcar. The  trial head was then again inserted on the femoral component and found to be appropriate. The trial head was then removed and the permanent head was  Morse tapered onto the femoral stem and then reduced into the acetabulum.    The hip stability and length were reassessed and found to be satisfactory.  The wound was then copiously irrigated with normal saline solution. The tagged sutures of the capsule and piriformis were sewn to the gluteus medius tendon. This adequately closed the hip capsule. The IT band and gluteus maximus fascia were then closed with 0-Vicryl in a running, locked fashion. A mixture of Exparil and bupivicaine was administered.  The subdermal layer was closed with 2-0 Vicryl in a buried interrupted fashion. Skin was approximated staples.  The wound was then covered with Honeycomb dressing.  An abduction pillow was placed. The patient was mobilized from the lateral position back to supine on the operating room table and then awakened from anesthesia without complication.  POSTOPERATIVE PLAN: The patient will be WBAT on operative extremity. Lovenox 40mg /day x 4 weeks to start on POD#1. Ancef x 24 hours. PT/OT on POD#1. Posterior hip precautions.

## 2020-03-06 NOTE — H&P (Signed)
H&P reviewed. No significant changes noted.  

## 2020-03-06 NOTE — Anesthesia Procedure Notes (Addendum)
Spinal  Patient location during procedure: OR Start time: 03/06/2020 12:25 PM End time: 03/06/2020 12:46 PM Staffing Performed: anesthesiologist  Anesthesiologist: Tera Mater, MD Preanesthetic Checklist Completed: patient identified, IV checked, site marked, risks and benefits discussed, surgical consent, monitors and equipment checked, pre-op evaluation and timeout performed Spinal Block Patient position: sitting Prep: ChloraPrep Patient monitoring: heart rate, continuous pulse ox, blood pressure and cardiac monitor Approach: midline Location: L3-4 Injection technique: single-shot Needle Needle type: Whitacre and Introducer  Needle gauge: 24 G Needle length: 9 cm Additional Notes Negative paresthesia. Negative blood return. Positive free-flowing CSF. Expiration date of kit checked and confirmed. Patient tolerated procedure well, without complications. First attempts by CRNA in lateral position unsuccessful.  Patient re-positioned to sitting and given more sedation, spinal then placed by MD with one attempt.

## 2020-03-06 NOTE — Plan of Care (Signed)
  Problem: Education: Goal: Knowledge of General Education information will improve Description Including pain rating scale, medication(s)/side effects and non-pharmacologic comfort measures Outcome: Progressing   

## 2020-03-06 NOTE — Anesthesia Preprocedure Evaluation (Addendum)
Anesthesia Evaluation  Patient identified by MRN, date of birth, ID band Patient awake    Reviewed: Allergy & Precautions, H&P , NPO status , Patient's Chart, lab work & pertinent test results  Airway Mallampati: II  TM Distance: >3 FB     Dental  (+) Chipped   Pulmonary neg COPD, Not current smoker, former smoker,    breath sounds clear to auscultation       Cardiovascular hypertension, (-) angina(-) Past MI  Rhythm:regular Rate:Normal     Neuro/Psych H/o lumbar laminectomy negative psych ROS   GI/Hepatic negative GI ROS, Neg liver ROS,   Endo/Other  Hypothyroidism   Renal/GU      Musculoskeletal   Abdominal   Peds  Hematology negative hematology ROS (+)   Anesthesia Other Findings Past Medical History: No date: Anosmia No date: Arm fracture     Comment:  right arm No date: Ataxia No date: Colon polyp No date: ED (erectile dysfunction)     Comment:  of organic origin No date: Elevated PSA No date: Hypertension No date: Hypothyroidism No date: Legally blind in left eye, as defined in [place] No date: Neuropathy     Comment:  peripheral neuropathy No date: Osteoarthritis 1960: Pneumonia  Past Surgical History: No date: APPENDECTOMY 04/19/2010: CATARACT EXTRACTION 01/04/2017: COLONOSCOPY WITH PROPOFOL; N/A     Comment:  Procedure: COLONOSCOPY WITH PROPOFOL;  Surgeon:               Lollie Sails, MD;  Location: [facility] ENDOSCOPY;                Service: Endoscopy;  Laterality: N/A; 2003: EYE SURGERY; Left     Comment:  vitrectomy with TPA injection 1973, 2000, 2002: KNEE ARTHROSCOPY; Bilateral     Comment:  1973 left, 2000 right, 2002 right 1999: left hip pin; Left No date: LUMBAR LAMINECTOMY 1979: rectal cauterization No date: TONSILLECTOMY  BMI    Body Mass Index: 20.61 kg/m      Reproductive/Obstetrics negative OB ROS                            Anesthesia  Physical Anesthesia Plan  ASA: II  Anesthesia Plan: Spinal   Post-op Pain Management:    Induction:   PONV Risk Score and Plan: Propofol infusion  Airway Management Planned: Simple Face Mask  Additional Equipment:   Intra-op Plan:   Post-operative Plan:   Informed Consent: I have reviewed the patients History and Physical, chart, labs and discussed the procedure including the risks, benefits and alternatives for the proposed anesthesia with the patient or authorized representative who has indicated his/her understanding and acceptance.     Dental Advisory Given  Plan Discussed with: Anesthesiologist, CRNA and Surgeon  Anesthesia Plan Comments:         Anesthesia Quick Evaluation

## 2020-03-06 NOTE — Progress Notes (Signed)
PROGRESS NOTE    Robert Knight  MHD:[id number] DOB: 1935/01/31 DOA: 03/05/2020 PCP: Kirk Ruths, MD   Assessment & Plan:   Principal Problem:   Closed right hip fracture, initial encounter [facility]) Active Problems:   Hypertension   Hypothyroidism   Right hip fracture: secondary to mechanical fall. NPO. Surgery today as per ortho surg.   HTN: continue to hold home dose of lisinopril. IV labetalol prn   Hypothyroidism: continue on levothyroxine   Mild cognitive impairment: continue w/ supportive care    Hyperglycemia: no hx of DM. Will continue to monitor  Leukocytosis: likely reactive. Will continue to monitor     DVT prophylaxis: SCDs Code Status: full Family Communication: discussed w/ pt's family at bedside and answer their questions Disposition Plan: depends on PT/OT recs  Consultants:   Ortho surg   Procedures:    Antimicrobials:    Status is: Inpatient  Remains inpatient appropriate because:Ongoing diagnostic testing needed not appropriate for outpatient work up, getting hip surgery today    Dispo: The patient is from: Home              Anticipated d/c is to: SNF              Anticipated d/c date is: 3 days              Patient currently is not medically stable to d/c.     Subjective: Pt c/o right hip pain   Objective: Vitals:   03/05/20 2055 03/06/20 0039 03/06/20 0452 03/06/20 0701  BP: (!) 149/72 (!) 135/72 (!) 135/81   Pulse: 94 79 88   Resp: 18 19 17    Temp: 98.1 F (36.7 C) 98.4 F (36.9 C) 99 F (37.2 C)   TempSrc: Oral  Oral   SpO2: 100% 100% 99% 98%  Weight:      Height:        Intake/Output Summary (Last 24 hours) at 03/06/2020 0818 Last data filed at 03/06/2020 0650 Gross per 24 hour  Intake 754.91 ml  Output 900 ml  Net -145.09 ml   Filed Weights   03/05/20 1613  Weight: 68.9 kg    Examination:  General exam: Appears calm but uncomfortable  Respiratory system: Clear to auscultation. No wheezes,  rales Cardiovascular system: S1 & S2 +. No rubs, gallops or clicks. No pedal edema. Gastrointestinal system: Abdomen is nondistended, soft and nontender.  Normal bowel sounds heard. Central nervous system: Alert and oriented. Moves all 4 extremities  Psychiatry: Judgement and insight appear normal. Flat mood and affect.     Data Reviewed: I have personally reviewed following labs and imaging studies  CBC: Recent Labs  Lab 03/05/20 1802  WBC 12.6*  NEUTROABS 10.4*  HGB 15.2  HCT 43.1  MCV 89.8  PLT 211   Basic Metabolic Panel: Recent Labs  Lab 03/05/20 1802  NA 142  K 3.9  CL 105  CO2 23  GLUCOSE 113*  BUN 18  CREATININE 0.96  CALCIUM 9.4   GFR: Estimated Creatinine Clearance: 57.8 mL/min (by C-G formula based on SCr of 0.96 mg/dL). Liver Function Tests: No results for input(s): AST, ALT, ALKPHOS, BILITOT, PROT, ALBUMIN in the last 168 hours. No results for input(s): LIPASE, AMYLASE in the last 168 hours. No results for input(s): AMMONIA in the last 168 hours. Coagulation Profile: No results for input(s): INR, PROTIME in the last 168 hours. Cardiac Enzymes: No results for input(s): CKTOTAL, CKMB, CKMBINDEX, TROPONINI in the last 168 hours. BNP (  last 3 results) No results for input(s): PROBNP in the last 8760 hours. HbA1C: No results for input(s): HGBA1C in the last 72 hours. CBG: No results for input(s): GLUCAP in the last 168 hours. Lipid Profile: No results for input(s): CHOL, HDL, LDLCALC, TRIG, CHOLHDL, LDLDIRECT in the last 72 hours. Thyroid Function Tests: No results for input(s): TSH, T4TOTAL, FREET4, T3FREE, THYROIDAB in the last 72 hours. Anemia Panel: No results for input(s): VITAMINB12, FOLATE, FERRITIN, TIBC, IRON, RETICCTPCT in the last 72 hours. Sepsis Labs: No results for input(s): PROCALCITON, LATICACIDVEN in the last 168 hours.  Recent Results (from the past 240 hour(s))  SARS Coronavirus 2 by RT PCR (hospital order, performed in [facility]  hospital lab) Nasopharyngeal Nasopharyngeal Swab     Status: None   Collection Time: 03/05/20  6:02 PM   Specimen: Nasopharyngeal Swab  Result Value Ref Range Status   SARS Coronavirus 2 NEGATIVE NEGATIVE Final    Comment: (NOTE) SARS-CoV-2 target nucleic acids are NOT DETECTED.  The SARS-CoV-2 RNA is generally detectable in upper and lower respiratory specimens during the acute phase of infection. The lowest concentration of SARS-CoV-2 viral copies this assay can detect is 250 copies / mL. A negative result does not preclude SARS-CoV-2 infection and should not be used as the sole basis for treatment or other patient management decisions.  A negative result may occur with improper specimen collection / handling, submission of specimen other than nasopharyngeal swab, presence of viral mutation(s) within the areas targeted by this assay, and inadequate number of viral copies (<250 copies / mL). A negative result must be combined with clinical observations, patient history, and epidemiological information.  Fact Sheet for Patients:   StrictlyIdeas.no  Fact Sheet for Healthcare Providers: BankingDealers.co.za  This test is not yet approved or  cleared by the [place] FDA and has been authorized for detection and/or diagnosis of SARS-CoV-2 by FDA under an Emergency Use Authorization (EUA).  This EUA will remain in effect (meaning this test can be used) for the duration of the COVID-19 declaration under Section 564(b)(1) of the Act, 21 U.S.C. section 360bbb-3(b)(1), unless the authorization is terminated or revoked sooner.  Performed at [facility], [address] [place], [place] [address]          Radiology Studies: DG Chest 1 View  Result Date: 03/05/2020 CLINICAL DATA:  Preop hip fracture EXAM: CHEST  1 VIEW COMPARISON:  03/05/2020 FINDINGS: No acute consolidation or pleural effusion. Normal cardiomediastinal  silhouette with aortic atherosclerosis. Prominent skin fold artifact over the left lateral chest. IMPRESSION: No active disease. Electronically Signed   By: Donavan Foil M.D.   On: 03/05/2020 18:36   DG Pelvis 1-2 Views  Result Date: 03/05/2020 CLINICAL DATA:  Right hip and distal femur pain after tripped on steps today. Unable to straighten right leg. EXAM: PELVIS - 1-2 VIEW COMPARISON:  None. FINDINGS: Mildly displaced and angulated right femoral neck fracture. The femoral head remains seated in the acetabulum. The pubic rami are intact. No additional fracture of the pelvis. 3 screws in the left proximal femur appear intact. IMPRESSION: Mildly displaced and angulated right femoral neck fracture. Electronically Signed   By: Keith Rake M.D.   On: 03/05/2020 17:46   DG Femur Min 2 Views Right  Result Date: 03/05/2020 CLINICAL DATA:  Right hip and distal femur pain after trip down steps today. EXAM: RIGHT FEMUR 2 VIEWS COMPARISON:  None. FINDINGS: Mildly displaced and angulated femoral neck fracture. No additional fracture of  the more distal femur. Knee alignment is maintained with osteoarthritis. There is a small knee joint effusion. IMPRESSION: Mildly displaced and angulated right femoral neck fracture. The distal femur is intact. Electronically Signed   By: Keith Rake M.D.   On: 03/05/2020 17:47        Scheduled Meds: Continuous Infusions: . sodium chloride 85 mL/hr at 03/06/20 0600  .  ceFAZolin (ANCEF) IV       LOS: 1 day    Time spent: 34 mins     Wyvonnia Dusky, MD [facility] Pager [phone]  If 7PM-7AM, please contact night-coverage [email or web address] 03/06/2020, 8:18 AM

## 2020-03-06 NOTE — Progress Notes (Signed)
Initial Nutrition Assessment  DOCUMENTATION CODES:   Not applicable  INTERVENTION:  Ensure Enlive po BID, each supplement provides 350 kcal and 20 grams of protein  1 packet Juven BID, each packet provides 95 calories, 2.5 grams of protein (collagen), and 9.8 grams of carbohydrate (3 grams sugar); also contains 7 grams of L-arginine and L-glutamine, 300 mg vitamin C, 15 mg vitamin E, 1.2 mcg vitamin B-12, 9.5 mg zinc, 200 mg calcium, and 1.5 g  Calcium Beta-hydroxy-Beta-methylbutyrate to support wound healing  MVI with minerals daily   NUTRITION DIAGNOSIS:   Increased nutrient needs related to post-op healing, hip fracture as evidenced by estimated needs.    GOAL:   Patient will meet greater than or equal to 90% of their needs   MONITOR:   Labs, Diet advancement, Supplement acceptance, Weight trends, Skin, PO intake  REASON FOR ASSESSMENT:   Consult Hip fracture protocol  ASSESSMENT:  RD working remotely.  85 year old with medical history significant for mild cognitive impairment, HTN, osteoarthritis, hypothyroidism, left eye blindness, peripheral neuropathy presented with right leg pain after a fall admitted for mildly displaced angulated right femoral neck fracture.  Patient is currently NPO, surgery planned for today. Will order Ensure with det advancement to aid with increased needs as well as Juven to support post-op wound healing.   Per chart, weights have trended down 5 lb (3.2%) in the past 6 weeks and ~12 lb (7.1%) over the last 5 months which is insignificant for time frame, however concerning given advanced age. Will plan to complete nutrition-focused physical exam at follow-up.  NUTRITION - FOCUSED PHYSICAL EXAM: Unable to complete at this time, RD working remotely.  Diet Order:   Diet Order            Diet NPO time specified  Diet effective midnight                 EDUCATION NEEDS:   No education needs have been identified at this time  Skin:   Skin Assessment: Reviewed RN Assessment  Last BM:  7/30  Height:   Ht Readings from Last 1 Encounters:  03/05/20 6' (1.829 m)    Weight:   Wt Readings from Last 1 Encounters:  03/05/20 68.9 kg    Ideal Body Weight:  80.9 kg  BMI:  Body mass index is 20.61 kg/m.  Estimated Nutritional Needs:   Kcal:  2065-2275  Protein:  100-110  Fluid:  2 L/day   Lajuan Lines, RD, LDN Clinical Nutrition After Hours/Weekend Pager # in [place]

## 2020-03-07 LAB — BASIC METABOLIC PANEL
Anion gap: 6 (ref 5–15)
BUN: 22 mg/dL (ref 8–23)
CO2: 27 mmol/L (ref 22–32)
Calcium: 7.9 mg/dL — ABNORMAL LOW (ref 8.9–10.3)
Chloride: 109 mmol/L (ref 98–111)
Creatinine, Ser: 0.85 mg/dL (ref 0.61–1.24)
GFR calc Af Amer: >60 mL/min (ref >60)
GFR calc non Af Amer: >60 mL/min (ref >60)
Glucose, Bld: 124 mg/dL — ABNORMAL HIGH (ref 70–99)
Potassium: 4 mmol/L (ref 3.5–5.1)
Sodium: 142 mmol/L (ref 135–145)

## 2020-03-07 LAB — CBC
HCT: 33 % — ABNORMAL LOW (ref 39.0–52.0)
Hemoglobin: 10.8 g/dL — ABNORMAL LOW (ref 13.0–17.0)
MCH: 31 pg (ref 26.0–34.0)
MCHC: 32.7 g/dL (ref 30.0–36.0)
MCV: 94.8 fL (ref 80.0–100.0)
Platelets: 135 10*3/uL — ABNORMAL LOW (ref 150–400)
RBC: 3.48 MIL/uL — ABNORMAL LOW (ref 4.22–5.81)
RDW: 13.5 % (ref 11.5–15.5)
WBC: 8.7 10*3/uL (ref 4.0–10.5)
nRBC: 0 % (ref 0.0–0.2)

## 2020-03-07 MED ORDER — TRAMADOL HCL 50 MG PO TABS: 50.0000 mg | ORAL_TABLET | Freq: Four times a day (QID) | ORAL | 0 refills | Status: DC | PRN

## 2020-03-07 MED ORDER — OXYCODONE HCL 5 MG PO TABS: 5.0000 mg | ORAL_TABLET | Freq: Four times a day (QID) | ORAL | 0 refills | Status: DC | PRN

## 2020-03-07 MED ORDER — LISINOPRIL 10 MG PO TABS
10.0000 mg | ORAL_TABLET | Freq: Every day | ORAL | Status: DC
  Administered 2020-03-07 – 2020-03-08 (×2): 10 mg via ORAL
  Filled 2020-03-07 (×2): qty 1

## 2020-03-07 MED ORDER — ONDANSETRON HCL 4 MG PO TABS: 4.0000 mg | ORAL_TABLET | Freq: Four times a day (QID) | ORAL | 0 refills | Status: DC | PRN

## 2020-03-07 MED ORDER — ENOXAPARIN SODIUM 40 MG/0.4ML ~~LOC~~ SOLN: 40.0000 mg | SUBCUTANEOUS | 0 refills | Status: DC

## 2020-03-07 NOTE — Anesthesia Post-op Follow-up Note (Signed)
  Anesthesia Pain Follow-up Note  Patient: Robert Knight  Day #: 1  Date of Follow-up: 03/07/2020 Time: 10:05 AM  Last Vitals:  Vitals:   03/07/20 0027 03/07/20 0755  BP: 115/69 (!) 147/88  Pulse: 70 81  Resp: 20 18  Temp: 36.8 C 37 C  SpO2: 100% 100%    Level of Consciousness: alert  Pain: mild   Side Effects:None  Catheter Site Exam:clean, dry  Anti-Coag Meds (From admission, onward)   Start     Dose/Rate Route Frequency Ordered Stop   03/07/20 0800  enoxaparin (LOVENOX) injection 40 mg        40 mg Subcutaneous Every 24 hours 03/06/20 1548     03/07/20 0000  enoxaparin (LOVENOX) 40 MG/0.4ML injection        40 mg Subcutaneous Every 24 hours 03/07/20 0731 03/21/20 2359       Plan: D/C from anesthesia care at surgeon's request  Martha Clan

## 2020-03-07 NOTE — Progress Notes (Signed)
  Subjective: 1 Day Post-Op Procedure(s) (LRB): ARTHROPLASTY BIPOLAR HIP (HEMIARTHROPLASTY) (Right) Patient reports pain as mild.   Patient is well, and has had no acute complaints or problems Plan is to go home versus Rehab after hospital stay. Negative for chest pain and shortness of breath Fever: no Gastrointestinal: Negative for nausea and vomiting  Objective: Vital signs in last 24 hours: Temp:  [96.8 F (36 C)-98.5 F (36.9 C)] 98.3 F (36.8 C) (08/01 0027) Pulse Rate:  [56-80] 70 (08/01 0027) Resp:  [13-24] 20 (08/01 0027) BP: (100-144)/(48-80) 115/69 (08/01 0027) SpO2:  [95 %-100 %] 100 % (08/01 0027)  Intake/Output from previous day:  Intake/Output Summary (Last 24 hours) at 03/07/2020 0728 Last data filed at 03/07/2020 0500 Gross per 24 hour  Intake 2210.08 ml  Output 1500 ml  Net 710.08 ml    Intake/Output this shift: No intake/output data recorded.  Labs: Recent Labs    03/05/20 1802 03/06/20 0834 03/07/20 0527  HGB 15.2 13.0 10.8*   Recent Labs    03/06/20 0834 03/07/20 0527  WBC 9.3 8.7  RBC 4.28 3.48*  HCT 39.6 33.0*  PLT 155 135*   Recent Labs    03/06/20 0834 03/07/20 0527  NA 143 142  K 4.0 4.0  CL 108 109  CO2 26 27  BUN 22 22  CREATININE 1.21 0.85  GLUCOSE 130* 124*  CALCIUM 8.4* 7.9*   No results for input(s): LABPT, INR in the last 72 hours.   EXAM General - Patient is Alert and Oriented Extremity - Neurovascular intact Sensation intact distally Dorsiflexion/Plantar flexion intact Compartment soft Dressing/Incision - clean, dry, no drainage Motor Function - intact, moving foot and toes well on exam.   Past Medical History:  Diagnosis Date  . Anosmia   . Arm fracture    right arm  . Ataxia   . Colon polyp   . ED (erectile dysfunction)    of organic origin  . Elevated PSA   . Hypertension   . Hypothyroidism   . Legally blind in left eye, as defined in [place]   . Neuropathy    peripheral neuropathy  . Osteoarthritis    . Pneumonia 1960    Assessment/Plan: 1 Day Post-Op Procedure(s) (LRB): ARTHROPLASTY BIPOLAR HIP (HEMIARTHROPLASTY) (Right) Principal Problem:   Closed right hip fracture, initial encounter [facility]) Active Problems:   Hypertension   Hypothyroidism  Estimated body mass index is 20.61 kg/m as calculated from the following:   Height as of this encounter: 6' (1.829 m).   Weight as of this encounter: 68.9 kg. Advance diet Up with therapy D/C IV fluids  Follow-up at [facility] clinic orthopedics in 2 weeks for staple removals and x-rays. Discharge to rehab versus home when cleared by medicine  DVT Prophylaxis - Lovenox, Foot Pumps and TED hose Weight-Bearing as tolerated to right leg  Reche Dixon, PA-C Orthopaedic Surgery 03/07/2020, 7:28 AM

## 2020-03-07 NOTE — Evaluation (Signed)
Occupational Therapy Evaluation Patient Details Name: Robert Knight MRN: [id number] DOB: 07-Jul-1935 Today's Date: 03/07/2020    History of Present Illness Patient is an 85 y/o M that presents w/ R hip fracture s/p arthroplasty after fall. 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   Clinical Impression   Robert Knight was seen for OT evaluation this date. Prior to hospital admission, pt was Independent c I/ADLs and mobility including walking a mile/day. Pt lives c wife and children live nearby. Pt presents to acute OT demonstrating impaired ADL performance and functional mobility 2/2 decreased safety awareness, functional strength/balance/ROM deficits, and decreased activity tolerance. Pt currently requires Increased time don/doff L sock seated EOB - anticipate MIN A for RLE to maintian post hip pcns. MIN A + RW for ADL t/fs - VCs for hand placement. CGA + RW don mask standing in room. Pt would benefit from skilled OT to address noted impairments and functional limitations (see below for any additional details) in order to maximize safety and independence while minimizing falls risk and caregiver burden. Upon hospital discharge, recommend HHOT to maximize pt safety and return to functional independence during meaningful occupations of daily life.     Follow Up Recommendations  Home health OT;Supervision/Assistance - 24 hour    Equipment Recommendations       Recommendations for Other Services       Precautions / Restrictions Precautions Precautions: Fall;Posterior Hip Restrictions Weight Bearing Restrictions: Yes RLE Weight Bearing: Weight bearing as tolerated      Mobility Bed Mobility               General bed mobility comments: Pt received and left up in chair   Transfers Overall transfer level: Needs assistance Equipment used: Rolling walker (2 wheeled) Transfers: Sit to/from Stand Sit to Stand: Min assist         General  transfer comment: VCs for hand placement, MIN A for RW stabilizing     Balance Overall balance assessment: Needs assistance Sitting-balance support: No upper extremity supported Sitting balance-Leahy Scale: Good     Standing balance support: Bilateral upper extremity supported Standing balance-Leahy Scale: Good                             ADL either performed or assessed with clinical judgement   ADL Overall ADL's : Needs assistance/impaired                                       General ADL Comments: Increased time don/doff L sock seated EOB - anticipate MIN A for RLE to maintian post hip pcns. MIN A + RW for ADL t/fs - VCs for hand placement. CGA + RW don mask standing in room      Vision         Perception     Praxis      Pertinent Vitals/Pain Pain Assessment: Faces Faces Pain Scale: Hurts a little bit Pain Location: R hip Pain Descriptors / Indicators: Aching;Operative site guarding Pain Intervention(s): Limited activity within patient's tolerance;Monitored during session     Hand Dominance Right   Extremity/Trunk Assessment Upper Extremity Assessment Upper Extremity Assessment: Overall WFL for tasks assessed   Lower Extremity Assessment Lower Extremity Assessment: Overall WFL for tasks assessed       Communication  Communication Communication: HOH   Cognition Arousal/Alertness: Awake/alert Behavior During Therapy: WFL for tasks assessed/performed Overall Cognitive Status: Within Functional Limits for tasks assessed                                     General Comments  Seated after mobility in hall: SpO2 93% on RA, resolved c rest     Exercises Exercises: Other exercises Other Exercises Other Exercises: Pt and family educated re: OT role, DME recs, d/c recs, falls prevention, ECS, RW technique, adapted dressing technique Other Exercises: LBD, don/doff mask, sit<>stand x3, sitting/standing balance/tolerance    Shoulder Instructions      Home Living Family/patient expects to be discharged to:: Private residence Living Arrangements: Spouse/significant other Available Help at Discharge: Family;Available 24 hours/day Type of Home: House Home Access: Stairs to enter [employer] of Steps: 1   Home Layout: One level     Bathroom Shower/Tub: Walk-in shower;Tub/shower unit   Bathroom Toilet: Handicapped height     Home Equipment: Environmental consultant - 4 wheels;Shower seat          Prior Functioning/Environment Level of Independence: Independent with assistive device(s)        Comments: Patient had largely been independent with ambulation, occasional use of AD in the past. Reports he was walking more than a mile a day.        OT Problem List: Decreased strength;Decreased range of motion;Decreased activity tolerance;Impaired balance (sitting and/or standing);Decreased knowledge of use of DME or AE      OT Treatment/Interventions: Self-care/ADL training;Therapeutic exercise;DME and/or AE instruction;Energy conservation;Therapeutic activities;Patient/family education;Balance training    OT Goals(Current goals can be found in the care plan section) Acute Rehab OT Goals Patient Stated Goal: To return home safely OT Goal Formulation: With patient/family Time For Goal Achievement: 03/21/20 Potential to Achieve Goals: Good ADL Goals Pt Will Perform Grooming: with supervision;standing (c LRAD PRN) Pt Will Perform Lower Body Dressing: with min assist;with caregiver independent in assisting;sit to/from stand (c LRAD PRN) Pt Will Transfer to Toilet: with modified independence;ambulating;regular height toilet (c LRAD PRN)  OT Frequency: Min 2X/week   Barriers to D/C:            Co-evaluation PT/OT/SLP Co-Evaluation/Treatment: Yes Reason for Co-Treatment: To address functional/ADL transfers   OT goals addressed during session: ADL's and self-care;Strengthening/ROM;Proper use of Adaptive  equipment and DME      AM-PAC OT "6 Clicks" Daily Activity     Outcome Measure Help from another person eating meals?: None Help from another person taking care of personal grooming?: A Little Help from another person toileting, which includes using toliet, bedpan, or urinal?: A Little Help from another person bathing (including washing, rinsing, drying)?: A Little Help from another person to put on and taking off regular upper body clothing?: None Help from another person to put on and taking off regular lower body clothing?: A Little 6 Click Score: 20   End of Session Equipment Utilized During Treatment: Rolling walker;Gait belt  Activity Tolerance: Patient tolerated treatment well Patient left: in chair;with call bell/phone within reach;with chair alarm set;with family/visitor present  OT Visit Diagnosis: Other abnormalities of gait and mobility (R26.89)                Time: 7169-6789 OT Time Calculation (min): 34 min Charges:  OT General Charges $OT Visit: 1 Visit OT Evaluation $OT Eval Moderate Complexity: 1 Mod OT Treatments $Self Care/Home  Management : 8-22 mins  Dessie Coma, M.S. OTR/L  03/07/20, 3:49 PM  ascom [phone]

## 2020-03-07 NOTE — Anesthesia Postprocedure Evaluation (Signed)
Anesthesia Post Note  Patient: Jamal Collin Beer  Procedure(s) Performed: ARTHROPLASTY BIPOLAR HIP (HEMIARTHROPLASTY) (Right Hip)  Patient location during evaluation: Nursing Unit Anesthesia Type: Spinal Level of consciousness: oriented and awake and alert Pain management: pain level controlled Vital Signs Assessment: post-procedure vital signs reviewed and stable Respiratory status: spontaneous breathing, respiratory function stable and patient connected to nasal cannula oxygen Cardiovascular status: blood pressure returned to baseline and stable Postop Assessment: no headache, no backache, no apparent nausea or vomiting, patient able to bend at knees and adequate PO intake Anesthetic complications: no   No complications documented.   Last Vitals:  Vitals:   03/07/20 0027 03/07/20 0755  BP: 115/69 (!) 147/88  Pulse: 70 81  Resp: 20 18  Temp: 36.8 C 37 C  SpO2: 100% 100%    Last Pain:  Vitals:   03/07/20 0955  TempSrc:   PainSc: 7                  Martha Clan

## 2020-03-07 NOTE — Evaluation (Signed)
Physical Therapy Evaluation Patient Details Name: Robert Knight MRN: [id number] DOB: 1934/12/10 Today's Date: 03/07/2020   History of Present Illness  Patient is an 85 y/o M that presents w/ R hip fracture s/p arthroplasty after fall. 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  Clinical Impression  Patient is an 85 y/o M that presents with R hip fracture s/p arthroplasty on 03/06/2020. He is largely independent at baseline, reporting he ambulates at least a mile a day. He is present with son and spouse during this evaluation. He does require cuing and assistance to complete sit to stand transfers, largely because of his height. Once upright, he is able to ambulate ~125' with therapist this date, with minor rest breaks required for fatigue. He is able to ambulate without loss of balance or buckling, and will have round the clock assistance from spouse/children. Given the above and his handicap accessible living situation, it would be reasonable to expect him to do well with HHPT at this time given his performance today, though with the caveat of 24/7 mobility assistance given his difficulty with sit to stand transfers.     Follow Up Recommendations Home health PT (24/7 mobility assistance for now for transfers)     Equipment Recommendations  Rolling walker with 5" wheels    Recommendations for Other Services       Precautions / Restrictions Precautions Precautions: Fall;Posterior Hip Restrictions Weight Bearing Restrictions: Yes RLE Weight Bearing: Weight bearing as tolerated      Mobility  Bed Mobility               General bed mobility comments: Not tested, patient in chair upon arrival.  Transfers Overall transfer level: Needs assistance Equipment used: Rolling walker (2 wheeled) Transfers: Sit to/from Stand Sit to Stand: Min guard;Min assist         General transfer comment: Patient initially transferred by placing LUE on RW,  leading to significant lateral lean during transfer. Instructed patient to use both UEs to push off of the chair to make transfers on/off easier for him.  Ambulation/Gait Ambulation/Gait assistance: Supervision;Min guard Gait Distance (Feet): 125 Feet Assistive device: Rolling walker (2 wheeled) Gait Pattern/deviations: Step-to pattern   Gait velocity interpretation: <1.31 ft/sec, indicative of household ambulator General Gait Details: Instructed patient in step to gait pattern with RW, with no buckling or other deficits identified. He was noted to fatigue mildly throughout ambulation.  Stairs            Wheelchair Mobility    Modified Rankin (Stroke Patients Only)       Balance Overall balance assessment: Needs assistance;History of Falls Sitting-balance support: No upper extremity supported Sitting balance-Leahy Scale: Good     Standing balance support: Bilateral upper extremity supported Standing balance-Leahy Scale: Good                               Pertinent Vitals/Pain Pain Assessment: Faces Faces Pain Scale: Hurts a little bit Pain Location: R hip Pain Descriptors / Indicators: Aching;Operative site guarding Pain Intervention(s): Limited activity within patient's tolerance;Monitored during session    [place] expects to be discharged to:: Private residence Living Arrangements: Spouse/significant other Available Help at Discharge: Family;Available 24 hours/day Type of Home: House Home Access: Stairs to enter   [employer] of Steps: 1 Home Layout: One level Home Equipment: Walker - 4 wheels;Shower seat  Prior Function Level of Independence: Independent with assistive device(s)         Comments: Patient had largely been independent with ambulation, occasional use of AD in the past. Reports he was walking more than a mile a day.     Hand Dominance   Dominant Hand: Right    Extremity/Trunk Assessment    Upper Extremity Assessment Upper Extremity Assessment: Overall WFL for tasks assessed    Lower Extremity Assessment Lower Extremity Assessment: Overall WFL for tasks assessed       Communication   Communication: HOH  Cognition Arousal/Alertness: Awake/alert Behavior During Therapy: WFL for tasks assessed/performed Overall Cognitive Status: Within Functional Limits for tasks assessed                                        General Comments      Exercises     Assessment/Plan    PT Assessment Patient needs continued PT services  PT Problem List Decreased strength;Decreased mobility;Decreased safety awareness;Decreased range of motion;Decreased activity tolerance;Cardiopulmonary status limiting activity;Decreased balance;Decreased knowledge of use of DME;Decreased knowledge of precautions;Pain       PT Treatment Interventions DME instruction;Therapeutic exercise;Gait training;Balance training;Neuromuscular re-education;Stair training;Cognitive remediation;Therapeutic activities;Patient/family education    PT Goals (Current goals can be found in the Care Plan section)  Acute Rehab PT Goals Patient Stated Goal: To return home safely PT Goal Formulation: With patient/family Time For Goal Achievement: 03/21/20 Potential to Achieve Goals: Good    Frequency BID   Barriers to discharge        Co-evaluation PT/OT/SLP Co-Evaluation/Treatment: Yes Reason for Co-Treatment: To address functional/ADL transfers   OT goals addressed during session: ADL's and self-care;Proper use of Adaptive equipment and DME;Strengthening/ROM       AM-PAC PT "6 Clicks" Mobility  Outcome Measure Help needed turning from your back to your side while in a flat bed without using bedrails?: A Little Help needed moving from lying on your back to sitting on the side of a flat bed without using bedrails?: A Little Help needed moving to and from a bed to a chair (including a  wheelchair)?: A Little Help needed standing up from a chair using your arms (e.g., wheelchair or bedside chair)?: A Little Help needed to walk in hospital room?: A Little Help needed climbing 3-5 steps with a railing? : A Little 6 Click Score: 18    End of Session Equipment Utilized During Treatment: Gait belt Activity Tolerance: Patient tolerated treatment well;Patient limited by fatigue Patient left: in chair;with call bell/phone within reach;with chair alarm set;with family/visitor present Nurse Communication: Mobility status PT Visit Diagnosis: Difficulty in walking, not elsewhere classified (R26.2);Muscle weakness (generalized) (M62.81)    Time: 1324-4010 PT Time Calculation (min) (ACUTE ONLY): 34 min   Charges:   PT Evaluation $PT Eval Moderate Complexity: 1 Mod        Royce Macadamia PT, DPT, CSCS    03/07/2020, 2:39 PM

## 2020-03-07 NOTE — Discharge Instructions (Signed)

## 2020-03-07 NOTE — Plan of Care (Signed)
  Problem: Education: Goal: Knowledge of General Education information will improve Description Including pain rating scale, medication(s)/side effects and non-pharmacologic comfort measures Outcome: Progressing   

## 2020-03-07 NOTE — Progress Notes (Signed)
PROGRESS NOTE    Robert Knight  OMB:[id number] DOB: 05-Apr-1935 DOA: 03/05/2020 PCP: Kirk Ruths, MD   Assessment & Plan:   Principal Problem:   Closed right hip fracture, initial encounter [facility]) Active Problems:   Hypertension   Hypothyroidism   Right hip fracture: secondary to mechanical fall. S/p right hip hemiarthroplasty 03/06/20 as per ortho surg. PT/OT consulted   HTN: will restart home dose of lisinopril. IV labetalol prn   Hypothyroidism: continue on synthroid  Mild cognitive impairment: continue w/ supportive care    Hyperglycemia: no hx of DM. No need for insulin at this time. Will continue to monitor  Leukocytosis: resolved    DVT prophylaxis: SCDs Code Status: full Family Communication: discussed w/ pt's family at bedside and answer their questions Disposition Plan: depends on PT/OT recs  Consultants:   Ortho surg   Procedures:    Antimicrobials:    Status is: Inpatient  Remains inpatient appropriate because: unsafe d/c plan. Awaiting for PT/OT to see the pt   Dispo: The patient is from: Home              Anticipated d/c is to: SNF              Anticipated d/c date is: 3 days              Patient currently is not medically stable to d/c.     Subjective: Pt c/o right hip pain still but improved from day prior  Objective: Vitals:   03/06/20 1812 03/06/20 1933 03/07/20 0027 03/07/20 0755  BP: (!) 131/77 (!) 122/58 115/69 (!) 147/88  Pulse: 78 80 70 81  Resp: 17 16 20 18   Temp: 97.9 F (36.6 C) 98.3 F (36.8 C) 98.3 F (36.8 C) 98.6 F (37 C)  TempSrc: Oral Oral Oral Oral  SpO2: 100% 99% 100% 100%  Weight:      Height:        Intake/Output Summary (Last 24 hours) at 03/07/2020 0756 Last data filed at 03/07/2020 0500 Gross per 24 hour  Intake 2210.08 ml  Output 1500 ml  Net 710.08 ml   Filed Weights   03/05/20 1613  Weight: 68.9 kg    Examination:   General exam: Appears calm & comfortable  Respiratory  system: clear breath sounds b/l. No rhonchi  Cardiovascular system: S1 & S2 +. No rubs, gallops or clicks. No pedal edema. Gastrointestinal system: Abdomen is nondistended, soft and nontender.  Hypoactive bowel sounds heard. Central nervous system: Alert and oriented. Moves all 4 extremities  Psychiatry: Judgement and insight appear normal. Flat mood and affect.     Data Reviewed: I have personally reviewed following labs and imaging studies  CBC: Recent Labs  Lab 03/05/20 1802 03/06/20 0834 03/07/20 0527  WBC 12.6* 9.3 8.7  NEUTROABS 10.4*  --   --   HGB 15.2 13.0 10.8*  HCT 43.1 39.6 33.0*  MCV 89.8 92.5 94.8  PLT 207 155 453*   Basic Metabolic Panel: Recent Labs  Lab 03/05/20 1802 03/06/20 0834 03/07/20 0527  NA 142 143 142  K 3.9 4.0 4.0  CL 105 108 109  CO2 23 26 27   GLUCOSE 113* 130* 124*  BUN 18 22 22   CREATININE 0.96 1.21 0.85  CALCIUM 9.4 8.4* 7.9*   GFR: Estimated Creatinine Clearance: 65.3 mL/min (by C-G formula based on SCr of 0.85 mg/dL). Liver Function Tests: No results for input(s): AST, ALT, ALKPHOS, BILITOT, PROT, ALBUMIN in the last 168 hours. No  results for input(s): LIPASE, AMYLASE in the last 168 hours. No results for input(s): AMMONIA in the last 168 hours. Coagulation Profile: No results for input(s): INR, PROTIME in the last 168 hours. Cardiac Enzymes: No results for input(s): CKTOTAL, CKMB, CKMBINDEX, TROPONINI in the last 168 hours. BNP (last 3 results) No results for input(s): PROBNP in the last 8760 hours. HbA1C: No results for input(s): HGBA1C in the last 72 hours. CBG: Recent Labs  Lab 03/06/20 1452  GLUCAP 92   Lipid Profile: No results for input(s): CHOL, HDL, LDLCALC, TRIG, CHOLHDL, LDLDIRECT in the last 72 hours. Thyroid Function Tests: No results for input(s): TSH, T4TOTAL, FREET4, T3FREE, THYROIDAB in the last 72 hours. Anemia Panel: No results for input(s): VITAMINB12, FOLATE, FERRITIN, TIBC, IRON, RETICCTPCT in the  last 72 hours. Sepsis Labs: No results for input(s): PROCALCITON, LATICACIDVEN in the last 168 hours.  Recent Results (from the past 240 hour(s))  SARS Coronavirus 2 by RT PCR (hospital order, performed in [facility] hospital lab) Nasopharyngeal Nasopharyngeal Swab     Status: None   Collection Time: 03/05/20  6:02 PM   Specimen: Nasopharyngeal Swab  Result Value Ref Range Status   SARS Coronavirus 2 NEGATIVE NEGATIVE Final    Comment: (NOTE) SARS-CoV-2 target nucleic acids are NOT DETECTED.  The SARS-CoV-2 RNA is generally detectable in upper and lower respiratory specimens during the acute phase of infection. The lowest concentration of SARS-CoV-2 viral copies this assay can detect is 250 copies / mL. A negative result does not preclude SARS-CoV-2 infection and should not be used as the sole basis for treatment or other patient management decisions.  A negative result may occur with improper specimen collection / handling, submission of specimen other than nasopharyngeal swab, presence of viral mutation(s) within the areas targeted by this assay, and inadequate number of viral copies (<250 copies / mL). A negative result must be combined with clinical observations, patient history, and epidemiological information.  Fact Sheet for Patients:   StrictlyIdeas.no  Fact Sheet for Healthcare Providers: BankingDealers.co.za  This test is not yet approved or  cleared by the [place] FDA and has been authorized for detection and/or diagnosis of SARS-CoV-2 by FDA under an Emergency Use Authorization (EUA).  This EUA will remain in effect (meaning this test can be used) for the duration of the COVID-19 declaration under Section 564(b)(1) of the Act, 21 U.S.C. section 360bbb-3(b)(1), unless the authorization is terminated or revoked sooner.  Performed at [facility], [address], [place], [place] [address]           Radiology Studies: DG Chest 1 View  Result Date: 03/05/2020 CLINICAL DATA:  Preop hip fracture EXAM: CHEST  1 VIEW COMPARISON:  03/05/2020 FINDINGS: No acute consolidation or pleural effusion. Normal cardiomediastinal silhouette with aortic atherosclerosis. Prominent skin fold artifact over the left lateral chest. IMPRESSION: No active disease. Electronically Signed   By: Donavan Foil M.D.   On: 03/05/2020 18:36   DG Pelvis 1-2 Views  Result Date: 03/05/2020 CLINICAL DATA:  Right hip and distal femur pain after tripped on steps today. Unable to straighten right leg. EXAM: PELVIS - 1-2 VIEW COMPARISON:  None. FINDINGS: Mildly displaced and angulated right femoral neck fracture. The femoral head remains seated in the acetabulum. The pubic rami are intact. No additional fracture of the pelvis. 3 screws in the left proximal femur appear intact. IMPRESSION: Mildly displaced and angulated right femoral neck fracture. Electronically Signed   By: Aurther Loft.D.  On: 03/05/2020 17:46   DG Pelvis Portable  Result Date: 03/06/2020 CLINICAL DATA:  Postop right hip arthroplasty. EXAM: PORTABLE PELVIS 1-2 VIEWS COMPARISON:  Preoperative radiograph yesterday. FINDINGS: Right hip arthroplasty in expected alignment. No periprosthetic lucency or fracture. Recent postsurgical change includes air and edema in the joint space and soft tissues. Lateral skin staples. Prior surgical fixation of the left proximal femur again seen. IMPRESSION: Right hip arthroplasty without immediate postoperative complication. Electronically Signed   By: Keith Rake M.D.   On: 03/06/2020 16:36   DG Pelvis Portable  Result Date: 03/06/2020 CLINICAL DATA:  ORIF of a subcapital or basicervical RIGHT femoral neck fracture. EXAM: PORTABLE PELVIS 1-2 VIEWS 1:54 p.m.: COMPARISON:  03/05/2020. FINDINGS: Single AP image submitted for interpretation post-operatively demonstrates the preliminary hardware in the RIGHT femoral  neck. IMPRESSION: Image obtained during ORIF of the RIGHT femoral neck fracture. Electronically Signed   By: Evangeline Dakin M.D.   On: 03/06/2020 15:55   DG Femur Min 2 Views Right  Result Date: 03/05/2020 CLINICAL DATA:  Right hip and distal femur pain after trip down steps today. EXAM: RIGHT FEMUR 2 VIEWS COMPARISON:  None. FINDINGS: Mildly displaced and angulated femoral neck fracture. No additional fracture of the more distal femur. Knee alignment is maintained with osteoarthritis. There is a small knee joint effusion. IMPRESSION: Mildly displaced and angulated right femoral neck fracture. The distal femur is intact. Electronically Signed   By: Keith Rake M.D.   On: 03/05/2020 17:47        Scheduled Meds: . acetaminophen  1,000 mg Oral Q8H  . Chlorhexidine Gluconate Cloth  6 each Topical Daily  . docusate sodium  100 mg Oral BID  . enoxaparin (LOVENOX) injection  40 mg Subcutaneous Q24H  . feeding supplement (ENSURE ENLIVE)  237 mL Oral BID BM  . levothyroxine  75 mcg Oral QAC breakfast  . multivitamin with minerals  1 tablet Oral Daily  . nutrition supplement (JUVEN)  1 packet Oral BID BM   Continuous Infusions: . sodium chloride 75 mL/hr at 03/06/20 1600     LOS: 2 days    Time spent: 30 mins     Wyvonnia Dusky, MD [facility] Pager [phone]  If 7PM-7AM, please contact night-coverage [email or web address] 03/07/2020, 7:56 AM

## 2020-03-08 LAB — BASIC METABOLIC PANEL
Anion gap: 6 (ref 5–15)
BUN: 21 mg/dL (ref 8–23)
CO2: 29 mmol/L (ref 22–32)
Calcium: 8 mg/dL — ABNORMAL LOW (ref 8.9–10.3)
Chloride: 104 mmol/L (ref 98–111)
Creatinine, Ser: 0.85 mg/dL (ref 0.61–1.24)
GFR calc Af Amer: >60 mL/min (ref >60)
GFR calc non Af Amer: >60 mL/min (ref >60)
Glucose, Bld: 116 mg/dL — ABNORMAL HIGH (ref 70–99)
Potassium: 3.8 mmol/L (ref 3.5–5.1)
Sodium: 139 mmol/L (ref 135–145)

## 2020-03-08 LAB — CBC
HCT: 32.2 % — ABNORMAL LOW (ref 39.0–52.0)
Hemoglobin: 10.7 g/dL — ABNORMAL LOW (ref 13.0–17.0)
MCH: 31.6 pg (ref 26.0–34.0)
MCHC: 33.2 g/dL (ref 30.0–36.0)
MCV: 95 fL (ref 80.0–100.0)
Platelets: 140 10*3/uL — ABNORMAL LOW (ref 150–400)
RBC: 3.39 MIL/uL — ABNORMAL LOW (ref 4.22–5.81)
RDW: 13.2 % (ref 11.5–15.5)
WBC: 8.1 10*3/uL (ref 4.0–10.5)
nRBC: 0 % (ref 0.0–0.2)

## 2020-03-08 NOTE — Discharge Summary (Signed)
Physician Discharge Summary  Robert Knight DGL:[id number] DOB: 1935/04/22 DOA: 03/05/2020  PCP: Kirk Ruths, MD  Admit date: 03/05/2020 Discharge date: 03/08/2020  Admitted From: home Disposition:  Home w/ health   Recommendations for Outpatient Follow-up:  1. Follow up with PCP in 1 weeks 2. F/u ortho surg, Dr. Posey Pronto, in 2 weeks  Home Health: yes Equipment/Devices: walker  Discharge Condition: stable  CODE STATUS: full  Diet recommendation: Heart Healthy   Brief/Interim Summary: HPI was taken from Dr. Myna Hidalgo: Robert Knight is a 85 y.o. male with medical history significant for mild cognitive impairment, hypertension, and hypothyroidism, now presenting to the emergency department with severe right leg pain after a fall.  Patient was visiting his son's new home, did not realize that there were some steps where he was walking, stumbled, and fell directly onto his right hip.  He denies hitting his head or losing consciousness, reports that he had been in his usual state leading up to this, but was experiencing immediate and severe pain at the proximal right leg.  Patient reports that he is usually active, exercises regularly, and can easily ascend a flight of stairs without any significant dyspnea.  He never experiences chest pain with activity.  No recent fevers, chills, cough, or shortness of breath.  ED Course: Upon arrival to the ED, patient is found to be afebrile, saturating well on room air, and with stable blood pressure.  EKG features a sinus rhythm with PVCs and chronic RBBB.  Chest x-rays negative for acute cardiopulmonary disease.  Radiographs of the hip and femur demonstrate mildly displaced angulated right femoral neck fracture.  Chemistry panel is unremarkable and CBC notable for leukocytosis to 12,600.  Orthopedic surgery was consulted by the ED physician and recommended medical admission.  The patient was treated with morphine and Zofran in the ED.  COVID-19 screening test  is negative.   Hospital Course from Dr. Lenise Herald 7/31-03/08/20: Pt was found to have a right hip fracture from a fall at home. Pt is s/p right hip hemiarthroplasty. Pt tolerated the surgery fairly well. PT/OT saw the pt and recommended home health. Home health was set up by CM prior to d/c.  For more information please see previous progress notes.    Discharge Diagnoses:  Principal Problem:   Closed right hip fracture, initial encounter [facility]) Active Problems:   Hypertension   Hypothyroidism  Right hip fracture: secondary to mechanical fall. S/p right hip hemiarthroplasty 03/06/20 as per ortho surg. PT/OT recs home health   HTN: will restart home dose of lisinopril. IV labetalol prn   Hypothyroidism: continue on synthroid  Mild cognitive impairment: continue w/ supportive care   Hyperglycemia: no hx of DM. No need for insulin at this time. Will continue to monitor  Leukocytosis: resolved  Discharge Instructions  Discharge Instructions    Diet - low sodium heart healthy   Complete by: As directed    Discharge instructions   Complete by: As directed    F/U PCP in 1 week. F/u w/ ortho surg, Dr. Serita Grit, in 2 weeks   Increase activity slowly   Complete by: As directed    No wound care   Complete by: As directed      Allergies as of 03/08/2020   No Known Allergies     Medication List    TAKE these medications   aspirin EC 81 MG tablet Take 81 mg by mouth daily.   Cholecalciferol 50 MCG (2000 UT) Caps Take  2,000 Units by mouth daily.   Co Q-10 30 MG Caps Take 30 mg by mouth daily.   enoxaparin 40 MG/0.4ML injection Commonly known as: LOVENOX Inject 0.4 mLs (40 mg total) into the skin daily for 14 days.   fluticasone 50 MCG/ACT nasal spray Commonly known as: FLONASE Place 1 spray into both nostrils 2 (two) times daily as needed for allergies or rhinitis.   levothyroxine 75 MCG tablet Commonly known as: SYNTHROID Take 75 mcg by mouth daily before  breakfast. Daily on an empty stomach with a glass of water at least 30 to 60 minutes before meal   lisinopril 10 MG tablet Commonly known as: ZESTRIL Take 10 mg by mouth daily.   multivitamin tablet Take 1 tablet by mouth daily.   OMEGA 3-6-9 COMPLEX PO Take 1 capsule by mouth daily.   ondansetron 4 MG tablet Commonly known as: ZOFRAN Take 1 tablet (4 mg total) by mouth every 6 (six) hours as needed for nausea.   oxyCODONE 5 MG immediate release tablet Commonly known as: Oxy IR/ROXICODONE Take 1 tablet (5 mg total) by mouth every 6 (six) hours as needed for severe pain (pain score 7-10).   PRESERVISION AREDS 2 PO Take 1 tablet by mouth in the morning and at bedtime.   sildenafil 20 MG tablet Commonly known as: REVATIO Take 20-40 mg by mouth daily as needed (erectile dysfunction).   traMADol 50 MG tablet Commonly known as: ULTRAM Take 1 tablet (50 mg total) by mouth every 6 (six) hours as needed for moderate pain.            Durable Medical Equipment  (From admission, onward)         Start     Ordered   03/08/20 1010  For home use only DME Walker rolling  Once       Comments: Tall, needed  Question Answer Comment  Walker: With Jeannette Wheels   Patient needs a walker to treat with the following condition Weakness      03/08/20 1009   03/08/20 0804  For home use only DME Walker rolling  Once       Question Answer Comment  Walker: With 5 Inch Wheels   Patient needs a walker to treat with the following condition S/P right hip fracture      03/08/20 0804          Follow-up Information    Reche Dixon, PA-C On 03/23/2020.   Specialty: Orthopedic Surgery Why: For x-rays of the right hip and staple removal;  @ 2:00 pm Contact information: [place] [address] [phone]        Kirk Ruths, MD On 03/15/2020.   Specialty: Internal Medicine Why: @ 4:00 pm Contact information: [place] [address] [phone]              No Known Allergies  Consultations:  Ortho surg   Procedures/Studies: DG Chest 1 View  Result Date: 03/05/2020 CLINICAL DATA:  Preop hip fracture EXAM: CHEST  1 VIEW COMPARISON:  03/05/2020 FINDINGS: No acute consolidation or pleural effusion. Normal cardiomediastinal silhouette with aortic atherosclerosis. Prominent skin fold artifact over the left lateral chest. IMPRESSION: No active disease. Electronically Signed   By: Donavan Foil M.D.   On: 03/05/2020 18:36   DG Pelvis 1-2 Views  Result Date: 03/05/2020 CLINICAL DATA:  Right hip and distal femur pain after tripped on  steps today. Unable to straighten right leg. EXAM: PELVIS - 1-2 VIEW COMPARISON:  None. FINDINGS: Mildly displaced and angulated right femoral neck fracture. The femoral head remains seated in the acetabulum. The pubic rami are intact. No additional fracture of the pelvis. 3 screws in the left proximal femur appear intact. IMPRESSION: Mildly displaced and angulated right femoral neck fracture. Electronically Signed   By: Keith Rake M.D.   On: 03/05/2020 17:46   DG Pelvis Portable  Result Date: 03/06/2020 CLINICAL DATA:  Postop right hip arthroplasty. EXAM: PORTABLE PELVIS 1-2 VIEWS COMPARISON:  Preoperative radiograph yesterday. FINDINGS: Right hip arthroplasty in expected alignment. No periprosthetic lucency or fracture. Recent postsurgical change includes air and edema in the joint space and soft tissues. Lateral skin staples. Prior surgical fixation of the left proximal femur again seen. IMPRESSION: Right hip arthroplasty without immediate postoperative complication. Electronically Signed   By: Keith Rake M.D.   On: 03/06/2020 16:36   DG Pelvis Portable  Result Date: 03/06/2020 CLINICAL DATA:  ORIF of a subcapital or basicervical RIGHT femoral neck fracture. EXAM: PORTABLE PELVIS 1-2 VIEWS 1:54 p.m.: COMPARISON:  03/05/2020. FINDINGS:  Single AP image submitted for interpretation post-operatively demonstrates the preliminary hardware in the RIGHT femoral neck. IMPRESSION: Image obtained during ORIF of the RIGHT femoral neck fracture. Electronically Signed   By: Evangeline Dakin M.D.   On: 03/06/2020 15:55   DG Femur Min 2 Views Right  Result Date: 03/05/2020 CLINICAL DATA:  Right hip and distal femur pain after trip down steps today. EXAM: RIGHT FEMUR 2 VIEWS COMPARISON:  None. FINDINGS: Mildly displaced and angulated femoral neck fracture. No additional fracture of the more distal femur. Knee alignment is maintained with osteoarthritis. There is a small knee joint effusion. IMPRESSION: Mildly displaced and angulated right femoral neck fracture. The distal femur is intact. Electronically Signed   By: Keith Rake M.D.   On: 03/05/2020 17:47     Subjective: Pt c/o right hip pain    Discharge Exam: Vitals:   03/08/20 0725 03/08/20 1150  BP: (!) 130/75 (!) 146/67  Pulse: 82 84  Resp: 17 17  Temp: 97.8 F (36.6 C) 98.4 F (36.9 C)  SpO2: 98% 100%   Vitals:   03/07/20 0755 03/07/20 1634 03/08/20 0725 03/08/20 1150  BP: (!) 147/88 (!) 121/64 (!) 130/75 (!) 146/67  Pulse: 81 81 82 84  Resp: 18 14 17 17   Temp: 98.6 F (37 C) 97.7 F (36.5 C) 97.8 F (36.6 C) 98.4 F (36.9 C)  TempSrc: Oral Oral Oral Oral  SpO2: 100% 95% 98% 100%  Weight:      Height:        General: Pt is alert, awake, not in acute distress Cardiovascular: S1/S2 +, no rubs, no gallops Respiratory: CTA bilaterally, no wheezing, no rhonchi Abdominal: Soft, NT, ND, bowel sounds + Extremities: no edema, no cyanosis    The results of significant diagnostics from this hospitalization (including imaging, microbiology, ancillary and laboratory) are listed below for reference.     Microbiology: Recent Results (from the past 240 hour(s))  SARS Coronavirus 2 by RT PCR (hospital order, performed in [facility] hospital lab) Nasopharyngeal  Nasopharyngeal Swab     Status: None   Collection Time: 03/05/20  6:02 PM   Specimen: Nasopharyngeal Swab  Result Value Ref Range Status   SARS Coronavirus 2 NEGATIVE NEGATIVE Final    Comment: (NOTE) SARS-CoV-2 target nucleic acids are NOT DETECTED.  The SARS-CoV-2 RNA is generally detectable in upper  and lower respiratory specimens during the acute phase of infection. The lowest concentration of SARS-CoV-2 viral copies this assay can detect is 250 copies / mL. A negative result does not preclude SARS-CoV-2 infection and should not be used as the sole basis for treatment or other patient management decisions.  A negative result may occur with improper specimen collection / handling, submission of specimen other than nasopharyngeal swab, presence of viral mutation(s) within the areas targeted by this assay, and inadequate number of viral copies (<250 copies / mL). A negative result must be combined with clinical observations, patient history, and epidemiological information.  Fact Sheet for Patients:   StrictlyIdeas.no  Fact Sheet for Healthcare Providers: BankingDealers.co.za  This test is not yet approved or  cleared by the [place] FDA and has been authorized for detection and/or diagnosis of SARS-CoV-2 by FDA under an Emergency Use Authorization (EUA).  This EUA will remain in effect (meaning this test can be used) for the duration of the COVID-19 declaration under Section 564(b)(1) of the Act, 21 U.S.C. section 360bbb-3(b)(1), unless the authorization is terminated or revoked sooner.  Performed at [facility], [address] [place], [place] [address]      Labs: BNP (last 3 results) No results for input(s): BNP in the last 8760 hours. Basic Metabolic Panel: Recent Labs  Lab 03/05/20 1802 03/06/20 0834 03/07/20 0527 03/08/20 0440  NA 142 143 142 139  K 3.9 4.0 4.0 3.8  CL 105 108 109 104  CO2 23 26  27 29   GLUCOSE 113* 130* 124* 116*  BUN 18 22 22 21   CREATININE 0.96 1.21 0.85 0.85  CALCIUM 9.4 8.4* 7.9* 8.0*   Liver Function Tests: No results for input(s): AST, ALT, ALKPHOS, BILITOT, PROT, ALBUMIN in the last 168 hours. No results for input(s): LIPASE, AMYLASE in the last 168 hours. No results for input(s): AMMONIA in the last 168 hours. CBC: Recent Labs  Lab 03/05/20 1802 03/06/20 0834 03/07/20 0527 03/08/20 0440  WBC 12.6* 9.3 8.7 8.1  NEUTROABS 10.4*  --   --   --   HGB 15.2 13.0 10.8* 10.7*  HCT 43.1 39.6 33.0* 32.2*  MCV 89.8 92.5 94.8 95.0  PLT 207 155 135* 140*   Cardiac Enzymes: No results for input(s): CKTOTAL, CKMB, CKMBINDEX, TROPONINI in the last 168 hours. BNP: Invalid input(s): POCBNP CBG: Recent Labs  Lab 03/06/20 1452  GLUCAP 92   D-Dimer No results for input(s): DDIMER in the last 72 hours. Hgb A1c No results for input(s): HGBA1C in the last 72 hours. Lipid Profile No results for input(s): CHOL, HDL, LDLCALC, TRIG, CHOLHDL, LDLDIRECT in the last 72 hours. Thyroid function studies No results for input(s): TSH, T4TOTAL, T3FREE, THYROIDAB in the last 72 hours.  Invalid input(s): FREET3 Anemia work up No results for input(s): VITAMINB12, FOLATE, FERRITIN, TIBC, IRON, RETICCTPCT in the last 72 hours. Urinalysis    Component Value Date/Time   COLORURINE YELLOW 06/20/2017 0903   APPEARANCEUR Clear 01/23/2020 1042   LABSPEC 1.006 06/20/2017 0903   PHURINE 6.0 06/20/2017 0903   GLUCOSEU Negative 01/23/2020 1042   HGBUR NEGATIVE 06/20/2017 0903   BILIRUBINUR Negative 01/23/2020 1042   KETONESUR NEGATIVE 06/20/2017 0903   PROTEINUR Negative 01/23/2020 1042   PROTEINUR NEGATIVE 06/20/2017 0903   NITRITE Negative 01/23/2020 1042   NITRITE NEGATIVE 06/20/2017 0903   LEUKOCYTESUR Negative 01/23/2020 1042   Sepsis Labs Invalid input(s): PROCALCITONIN,  WBC,  LACTICIDVEN Microbiology Recent Results (from the past 240 hour(s))  SARS  Coronavirus  2 by RT PCR (hospital order, performed in [facility] hospital lab) Nasopharyngeal Nasopharyngeal Swab     Status: None   Collection Time: 03/05/20  6:02 PM   Specimen: Nasopharyngeal Swab  Result Value Ref Range Status   SARS Coronavirus 2 NEGATIVE NEGATIVE Final    Comment: (NOTE) SARS-CoV-2 target nucleic acids are NOT DETECTED.  The SARS-CoV-2 RNA is generally detectable in upper and lower respiratory specimens during the acute phase of infection. The lowest concentration of SARS-CoV-2 viral copies this assay can detect is 250 copies / mL. A negative result does not preclude SARS-CoV-2 infection and should not be used as the sole basis for treatment or other patient management decisions.  A negative result may occur with improper specimen collection / handling, submission of specimen other than nasopharyngeal swab, presence of viral mutation(s) within the areas targeted by this assay, and inadequate number of viral copies (<250 copies / mL). A negative result must be combined with clinical observations, patient history, and epidemiological information.  Fact Sheet for Patients:   StrictlyIdeas.no  Fact Sheet for Healthcare Providers: BankingDealers.co.za  This test is not yet approved or  cleared by the [place] FDA and has been authorized for detection and/or diagnosis of SARS-CoV-2 by FDA under an Emergency Use Authorization (EUA).  This EUA will remain in effect (meaning this test can be used) for the duration of the COVID-19 declaration under Section 564(b)(1) of the Act, 21 U.S.C. section 360bbb-3(b)(1), unless the authorization is terminated or revoked sooner.  Performed at [facility], [address], [place], [place] [address]      Time coordinating discharge: Over 30 minutes  SIGNED:   Wyvonnia Dusky, MD  [facility] 03/08/2020, 2:56 PM Pager   If 7PM-7AM, please contact  night-coverage [email or web address]

## 2020-03-08 NOTE — Progress Notes (Signed)
  Subjective: 2 Days Post-Op Procedure(s) (LRB): ARTHROPLASTY BIPOLAR HIP (HEMIARTHROPLASTY) (Right) Patient reports pain as mild.   Patient is well, and has had no acute complaints or problems Plan is to go home after hospital stay. Negative for chest pain and shortness of breath Fever: no Gastrointestinal: Negative for nausea and vomiting  Objective: Vital signs in last 24 hours: Temp:  [97.7 F (36.5 C)-98.6 F (37 C)] 97.7 F (36.5 C) (08/01 1634) Pulse Rate:  [81] 81 (08/01 1634) Resp:  [14-18] 14 (08/01 1634) BP: (121-147)/(64-88) 121/64 (08/01 1634) SpO2:  [95 %-100 %] 95 % (08/01 1634)  Intake/Output from previous day:  Intake/Output Summary (Last 24 hours) at 03/08/2020 0552 Last data filed at 03/08/2020 0226 Gross per 24 hour  Intake 960 ml  Output 451 ml  Net 509 ml    Intake/Output this shift: Total I/O In: -  Out: 450 [Urine:450]  Labs: Recent Labs    03/05/20 1802 03/06/20 0834 03/07/20 0527  HGB 15.2 13.0 10.8*   Recent Labs    03/06/20 0834 03/07/20 0527  WBC 9.3 8.7  RBC 4.28 3.48*  HCT 39.6 33.0*  PLT 155 135*   Recent Labs    03/06/20 0834 03/07/20 0527  NA 143 142  K 4.0 4.0  CL 108 109  CO2 26 27  BUN 22 22  CREATININE 1.21 0.85  GLUCOSE 130* 124*  CALCIUM 8.4* 7.9*   No results for input(s): LABPT, INR in the last 72 hours.   EXAM General - Patient is Alert and Oriented Extremity - Neurovascular intact Sensation intact distally Dorsiflexion/Plantar flexion intact Compartment soft Dressing/Incision - clean, dry, no drainage Motor Function - intact, moving foot and toes well on exam.  Ambulated 125 feet  Past Medical History:  Diagnosis Date  . Anosmia   . Arm fracture    right arm  . Ataxia   . Colon polyp   . ED (erectile dysfunction)    of organic origin  . Elevated PSA   . Hypertension   . Hypothyroidism   . Legally blind in left eye, as defined in [place]   . Neuropathy    peripheral neuropathy  .  Osteoarthritis   . Pneumonia 1960    Assessment/Plan: 2 Days Post-Op Procedure(s) (LRB): ARTHROPLASTY BIPOLAR HIP (HEMIARTHROPLASTY) (Right) Principal Problem:   Closed right hip fracture, initial encounter [facility]) Active Problems:   Hypertension   Hypothyroidism  Estimated body mass index is 20.61 kg/m as calculated from the following:   Height as of this encounter: 6' (1.829 m).   Weight as of this encounter: 68.9 kg. Advance diet Up with therapy D/C IV fluids  Follow-up at [facility] clinic orthopedics in 2 weeks for staple removals and x-rays. Discharge home with home health physical therapy when cleared by medicine  DVT Prophylaxis - Lovenox, Foot Pumps and TED hose Weight-Bearing as tolerated to right leg  Reche Dixon, PA-C Orthopaedic Surgery 03/08/2020, 5:52 AM

## 2020-03-08 NOTE — Care Management Important Message (Signed)
Important Message  Patient Details  Name: Robert Knight MRN: [id number] Date of Birth: 30-Mar-1935   Medicare Important Message Given:  Yes     Su Hilt, RN 03/08/2020, 10:20 AM

## 2020-03-08 NOTE — Progress Notes (Signed)
Physical Therapy Treatment Patient Details Name: Robert Knight MRN: [id number] DOB: 11-13-1934 Today's Date: 03/08/2020    History of Present Illness Patient is an 85 y/o M that presents w/ R hip fracture s/p arthroplasty after fall. 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    PT Comments    Ready for session.  Dressed in chair with son awaiting discharge home today.  Stood and is able to walk lap with RW and min guard.  To commode to void.  Lives in sr. Housing with no stairs.  Son in stating he would not need them and declined.  Questions answered.  Encouraged +1 assist with mobility at all times initially upon discharge.     Follow Up Recommendations  Home health PT     Equipment Recommendations  Rolling walker with 5" wheels    Recommendations for Other Services       Precautions / Restrictions Precautions Precautions: Fall;Posterior Hip Restrictions Weight Bearing Restrictions: Yes RLE Weight Bearing: Weight bearing as tolerated    Mobility  Bed Mobility               General bed mobility comments: Pt received and left up in chair   Transfers Overall transfer level: Needs assistance Equipment used: Rolling walker (2 wheeled) Transfers: Sit to/from Stand Sit to Stand: Min assist         General transfer comment: VCs for hand placement, MIN A for RW stabilizing   Ambulation/Gait Ambulation/Gait assistance: Min guard Gait Distance (Feet): 160 Feet Assistive device: Rolling walker (2 wheeled) Gait Pattern/deviations: Step-through pattern;Decreased step length - right;Decreased step length - left Gait velocity: decreased   General Gait Details: declined stairs, no stairs at home, Senior housing.   Stairs             Wheelchair Mobility    Modified Rankin (Stroke Patients Only)       Balance Overall balance assessment: Needs assistance Sitting-balance support: No upper extremity supported Sitting  balance-Leahy Scale: Good     Standing balance support: Bilateral upper extremity supported Standing balance-Leahy Scale: Fair                              Cognition Arousal/Alertness: Awake/alert Behavior During Therapy: WFL for tasks assessed/performed Overall Cognitive Status: Within Functional Limits for tasks assessed                                        Exercises      General Comments        Pertinent Vitals/Pain Pain Assessment: Faces Faces Pain Scale: Hurts a little bit Pain Location: R hip Pain Descriptors / Indicators: Aching;Operative site guarding Pain Intervention(s): Limited activity within patient's tolerance;Monitored during session;Repositioned    Home Living                      Prior Function            PT Goals (current goals can now be found in the care plan section) Progress towards PT goals: Progressing toward goals    Frequency    BID      PT Plan      Co-evaluation              AM-PAC PT "6 Clicks"  Mobility   Outcome Measure  Help needed turning from your back to your side while in a flat bed without using bedrails?: A Little Help needed moving from lying on your back to sitting on the side of a flat bed without using bedrails?: A Little Help needed moving to and from a bed to a chair (including a wheelchair)?: A Little Help needed standing up from a chair using your arms (e.g., wheelchair or bedside chair)?: A Little Help needed to walk in hospital room?: A Little Help needed climbing 3-5 steps with a railing? : A Little 6 Click Score: 18    End of Session Equipment Utilized During Treatment: Gait belt Activity Tolerance: Patient tolerated treatment well;Patient limited by fatigue Patient left: in chair;with call bell/phone within reach;with chair alarm set;with family/visitor present Nurse Communication: Mobility status PT Visit Diagnosis: Difficulty in walking, not elsewhere  classified (R26.2);Muscle weakness (generalized) (M62.81)     Time: 0347-4259 PT Time Calculation (min) (ACUTE ONLY): 14 min  Charges:  $Gait Training: 8-22 mins                    Chesley Noon, PTA 03/08/20, 8:55 AM

## 2020-03-08 NOTE — Evaluation (Signed)
Occupational Therapy Evaluation Patient Details Name: Robert Knight MRN: [id number] DOB: June 12, 1935 Today's Date: 03/08/2020    History of Present Illness Patient is an 85 y/o M that presents w/ R hip fracture s/p arthroplasty after fall. 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   Clinical Impression   Robert Knight was seen for OT treatment on this date. Upon arrival pt seated in chair dressed and agreeable to session. Pt participated in education session instructed in functional application of postrior hip precautions, adapted dressing/bathing techniques, DME recs, home/routines mgmt, falls prevention, ECS. Pt recalled 0/3 posterior hip pcns at start of session, able to recall 3/3 at end of session. Pt denies concerns for return home, eagerly awaiting discharge. Pt making good progress toward goals. Pt continues to benefit from skilled OT services to maximize return to PLOF and minimize risk of future falls, injury, caregiver burden, and readmission. Will continue to follow POC. Discharge recommendation remains appropriate.      Follow Up Recommendations  Home health OT;Supervision/Assistance - 24 hour    Equipment Recommendations  None recommended by OT    Recommendations for Other Services       Precautions / Restrictions Precautions Precautions: Fall;Posterior Hip Restrictions Weight Bearing Restrictions: Yes RLE Weight Bearing: Weight bearing as tolerated      Mobility Bed Mobility               General bed mobility comments: Pt received and left up in chair   Transfers Overall transfer level: Needs assistance Equipment used: Rolling walker (2 wheeled) Transfers: Sit to/from Stand Sit to Stand: Min assist         General transfer comment: VCs for hand placement, MIN A for RW stabilizing     Balance Overall balance assessment: Needs assistance Sitting-balance support: No upper extremity supported Sitting balance-Leahy  Scale: Good     Standing balance support: Bilateral upper extremity supported Standing balance-Leahy Scale: Fair                             ADL either performed or assessed with clinical judgement   ADL Overall ADL's : Needs assistance/impaired                                       General ADL Comments: Pt dressed upon arrival - participated in education re: adapted dressing/bathing techniques     Vision         Perception     Praxis      Pertinent Vitals/Pain Pain Assessment: No/denies pain Faces Pain Scale: Hurts a little bit Pain Location: R hip Pain Descriptors / Indicators: Aching;Operative site guarding Pain Intervention(s): Limited activity within patient's tolerance;Monitored during session;Repositioned     Hand Dominance     Extremity/Trunk Assessment             Communication     Cognition Arousal/Alertness: Awake/alert Behavior During Therapy: WFL for tasks assessed/performed Overall Cognitive Status: Within Functional Limits for tasks assessed                                     General Comments       Exercises Exercises: Other exercises Other Exercises Other Exercises: Pt educated re: functional  application of postrior hip precautions, adapted dressing/bathing techniques, DME recs, home/routines mgmt, falls prevention, ECS   Shoulder Instructions      Home Living                                          Prior Functioning/Environment                   OT Problem List:        OT Treatment/Interventions:      OT Goals(Current goals can be found in the care plan section) Acute Rehab OT Goals Patient Stated Goal: To return home safely OT Goal Formulation: With patient/family Time For Goal Achievement: 03/21/20 Potential to Achieve Goals: Good ADL Goals Pt Will Perform Grooming: with supervision;standing (c LRAD PRN) Pt Will Perform Lower Body Dressing: with min  assist;with caregiver independent in assisting;sit to/from stand (c LRAD PRN) Pt Will Transfer to Toilet: with modified independence;ambulating;regular height toilet (c LRAD PRN)  OT Frequency: Min 2X/week   Barriers to D/C:            Co-evaluation              AM-PAC OT "6 Clicks" Daily Activity     Outcome Measure Help from another person eating meals?: None Help from another person taking care of personal grooming?: A Little Help from another person toileting, which includes using toliet, bedpan, or urinal?: A Little Help from another person bathing (including washing, rinsing, drying)?: A Little Help from another person to put on and taking off regular upper body clothing?: None Help from another person to put on and taking off regular lower body clothing?: A Little 6 Click Score: 20   End of Session    Activity Tolerance: Patient tolerated treatment well Patient left: in chair;with call bell/phone within reach;with chair alarm set;with family/visitor present  OT Visit Diagnosis: Other abnormalities of gait and mobility (R26.89)                Time: 5597-4163 OT Time Calculation (min): 9 min Charges:  OT General Charges $OT Visit: 1 Visit OT Treatments $Self Care/Home Management : 8-22 mins  Dessie Coma, M.S. OTR/L  03/08/20, 10:59 AM  ascom [phone]

## 2020-03-08 NOTE — TOC Progression Note (Signed)
Transition of Care [facility]) - Progression Note    Patient Details  Name: QUINNLAN ABRUZZO MRN: [id number] Date of Birth: 12-28-1934  [facility] CM/SW [place], RN Phone Number: 03/08/2020, 10:16 AM  Clinical Narrative:   Spoke with the patient about DC plan, he asked that I call his wife Opal Sidles to confirm, He has a rollator at home but needs a Tall RW, I contacted [place] with [employer].  He has raised toilet seats, his son and daughter will also be helping at home, his wife provides transportation if needed, he is up to date with his PCP and can afford his medications., He is agreeable to [place], I called Helene Kelp with [facility] and they accepted the patient for Home health PT      Expected Discharge Plan: [place] Barriers to Discharge: Barriers Resolved  Expected Discharge Plan and Services Expected Discharge Plan: [place]   Discharge Planning Services: CM Consult   Living arrangements for the past 2 months: Single Family Home                 DME Arranged: Walker rolling DME Agency: [employer] Date DME Agency Contacted: 03/08/20 Time DME Agency Contacted: [phone] Representative spoke with at DME Agency: [place]: PT [place]: [facility] (formerly [employer]) Date [place]: 03/08/20 Time [place]: [place] Representative spoke with at [place]: [place] ([place]) Interventions    Readmission Risk Interventions No flowsheet data found.

## 2020-03-09 ENCOUNTER — Observation Stay: Payer: Medicare HMO | Admitting: Anesthesiology

## 2020-03-09 ENCOUNTER — Emergency Department: Payer: Medicare HMO

## 2020-03-09 ENCOUNTER — Other Ambulatory Visit: Payer: Self-pay

## 2020-03-09 ENCOUNTER — Observation Stay: Payer: Medicare HMO

## 2020-03-09 ENCOUNTER — Encounter: Payer: Self-pay | Admitting: Emergency Medicine

## 2020-03-09 ENCOUNTER — Observation Stay
Admission: EM | Admit: 2020-03-09 | Discharge: 2020-03-09 | Disposition: A | Discharge status: Home / Self Care | Payer: Medicare HMO | Attending: Orthopedic Surgery | Admitting: Orthopedic Surgery

## 2020-03-09 ENCOUNTER — Encounter
Admission: EM | Disposition: A | Discharge status: Home / Self Care | Payer: Self-pay | Source: Home / Self Care | Attending: Emergency Medicine

## 2020-03-09 DIAGNOSIS — S73004A Unspecified dislocation of right hip, initial encounter: Secondary | ICD-10-CM | POA: Diagnosis not present

## 2020-03-09 DIAGNOSIS — Y999 Unspecified external cause status: Secondary | ICD-10-CM | POA: Insufficient documentation

## 2020-03-09 DIAGNOSIS — I10 Essential (primary) hypertension: Secondary | ICD-10-CM | POA: Insufficient documentation

## 2020-03-09 DIAGNOSIS — E039 Hypothyroidism, unspecified: Secondary | ICD-10-CM | POA: Diagnosis not present

## 2020-03-09 DIAGNOSIS — Z87891 Personal history of nicotine dependence: Secondary | ICD-10-CM | POA: Insufficient documentation

## 2020-03-09 DIAGNOSIS — Y939 Activity, unspecified: Secondary | ICD-10-CM | POA: Insufficient documentation

## 2020-03-09 DIAGNOSIS — Z20822 Contact with and (suspected) exposure to covid-19: Secondary | ICD-10-CM | POA: Diagnosis not present

## 2020-03-09 DIAGNOSIS — Z419 Encounter for procedure for purposes other than remedying health state, unspecified: Secondary | ICD-10-CM

## 2020-03-09 DIAGNOSIS — W19XXXA Unspecified fall, initial encounter: Secondary | ICD-10-CM | POA: Diagnosis not present

## 2020-03-09 DIAGNOSIS — Y92002 Bathroom of unspecified non-institutional (private) residence single-family (private) house as the place of occurrence of the external cause: Secondary | ICD-10-CM | POA: Diagnosis not present

## 2020-03-09 DIAGNOSIS — Z96641 Presence of right artificial hip joint: Secondary | ICD-10-CM | POA: Insufficient documentation

## 2020-03-09 HISTORY — PX: HIP CLOSED REDUCTION: SHX983

## 2020-03-09 LAB — CBC WITH DIFFERENTIAL/PLATELET
Abs Immature Granulocytes: 0.04 10*3/uL (ref 0.00–0.07)
Basophils Absolute: 0 10*3/uL (ref 0.0–0.1)
Basophils Relative: 0 %
Eosinophils Absolute: 0.1 10*3/uL (ref 0.0–0.5)
Eosinophils Relative: 1 %
HCT: 32.2 % — ABNORMAL LOW (ref 39.0–52.0)
Hemoglobin: 11 g/dL — ABNORMAL LOW (ref 13.0–17.0)
Immature Granulocytes: 0 %
Lymphocytes Relative: 10 %
Lymphs Abs: 0.9 10*3/uL (ref 0.7–4.0)
MCH: 31.1 pg (ref 26.0–34.0)
MCHC: 34.2 g/dL (ref 30.0–36.0)
MCV: 91 fL (ref 80.0–100.0)
Monocytes Absolute: 0.9 10*3/uL (ref 0.1–1.0)
Monocytes Relative: 10 %
Neutro Abs: 7.4 10*3/uL (ref 1.7–7.7)
Neutrophils Relative %: 79 %
Platelets: 188 10*3/uL (ref 150–400)
RBC: 3.54 MIL/uL — ABNORMAL LOW (ref 4.22–5.81)
RDW: 13 % (ref 11.5–15.5)
WBC: 9.4 10*3/uL (ref 4.0–10.5)
nRBC: 0 % (ref 0.0–0.2)

## 2020-03-09 LAB — BASIC METABOLIC PANEL
Anion gap: 14 (ref 5–15)
BUN: 26 mg/dL — ABNORMAL HIGH (ref 8–23)
CO2: 21 mmol/L — ABNORMAL LOW (ref 22–32)
Calcium: 8.1 mg/dL — ABNORMAL LOW (ref 8.9–10.3)
Chloride: 104 mmol/L (ref 98–111)
Creatinine, Ser: 0.93 mg/dL (ref 0.61–1.24)
GFR calc Af Amer: >60 mL/min (ref >60)
GFR calc non Af Amer: >60 mL/min (ref >60)
Glucose, Bld: 122 mg/dL — ABNORMAL HIGH (ref 70–99)
Potassium: 3.7 mmol/L (ref 3.5–5.1)
Sodium: 139 mmol/L (ref 135–145)

## 2020-03-09 LAB — PROTIME-INR
INR: 1.1 (ref 0.8–1.2)
Prothrombin Time: 13.8 seconds (ref 11.4–15.2)

## 2020-03-09 LAB — SURGICAL PATHOLOGY

## 2020-03-09 LAB — SARS CORONAVIRUS 2 BY RT PCR (HOSPITAL ORDER, PERFORMED IN ~~LOC~~ HOSPITAL LAB): SARS Coronavirus 2: NEGATIVE

## 2020-03-09 SURGERY — CLOSED REDUCTION, HIP
Anesthesia: General | Site: Hip | Laterality: Right

## 2020-03-09 MED ORDER — ONDANSETRON HCL 4 MG/2ML IJ SOLN: 4.0000 mg | Freq: Once | INTRAMUSCULAR | Status: DC | PRN

## 2020-03-09 MED ORDER — METOCLOPRAMIDE HCL 5 MG/ML IJ SOLN: 5.0000 mg | Freq: Three times a day (TID) | INTRAMUSCULAR | Status: DC | PRN

## 2020-03-09 MED ORDER — ONDANSETRON HCL 4 MG PO TABS: 4.0000 mg | ORAL_TABLET | Freq: Four times a day (QID) | ORAL | Status: DC | PRN

## 2020-03-09 MED ORDER — SODIUM CHLORIDE 0.9 % IV SOLN: INTRAVENOUS | Status: DC

## 2020-03-09 MED ORDER — ACETAMINOPHEN 325 MG PO TABS: 325.0000 mg | ORAL_TABLET | Freq: Four times a day (QID) | ORAL | Status: DC | PRN

## 2020-03-09 MED ORDER — HYDROCODONE-ACETAMINOPHEN 7.5-325 MG PO TABS
1.0000 | ORAL_TABLET | ORAL | Status: DC | PRN
  Filled 2020-03-09: qty 2

## 2020-03-09 MED ORDER — HYDROCODONE-ACETAMINOPHEN 5-325 MG PO TABS: 1.0000 | ORAL_TABLET | ORAL | Status: DC | PRN

## 2020-03-09 MED ORDER — FENTANYL CITRATE (PF) 100 MCG/2ML IJ SOLN
INTRAMUSCULAR | Status: DC | PRN
  Administered 2020-03-09: 50 ug via INTRAVENOUS

## 2020-03-09 MED ORDER — PROPOFOL 10 MG/ML IV BOLUS
INTRAVENOUS | Status: DC | PRN
  Administered 2020-03-09: 30 mg via INTRAVENOUS
  Administered 2020-03-09: 10 mg via INTRAVENOUS
  Administered 2020-03-09: 20 mg via INTRAVENOUS

## 2020-03-09 MED ORDER — METOCLOPRAMIDE HCL 10 MG PO TABS: 5.0000 mg | ORAL_TABLET | Freq: Three times a day (TID) | ORAL | Status: DC | PRN

## 2020-03-09 MED ORDER — SODIUM CHLORIDE 0.9 % IV SOLN: Freq: Once | INTRAVENOUS | Status: AC

## 2020-03-09 MED ORDER — MORPHINE SULFATE (PF) 2 MG/ML IV SOLN: 0.5000 mg | INTRAVENOUS | Status: DC | PRN

## 2020-03-09 MED ORDER — HYDROMORPHONE HCL 1 MG/ML IJ SOLN
1.0000 mg | INTRAMUSCULAR | Status: AC
  Administered 2020-03-09: 1 mg via INTRAVENOUS
  Filled 2020-03-09: qty 1

## 2020-03-09 MED ORDER — FENTANYL CITRATE (PF) 100 MCG/2ML IJ SOLN: 25.0000 ug | INTRAMUSCULAR | Status: DC | PRN

## 2020-03-09 MED ORDER — HYDROCODONE-ACETAMINOPHEN 5-325 MG PO TABS: 1.0000 | ORAL_TABLET | Freq: Four times a day (QID) | ORAL | Status: DC | PRN

## 2020-03-09 MED ORDER — MORPHINE SULFATE (PF) 4 MG/ML IV SOLN: 4.0000 mg | INTRAVENOUS | Status: DC | PRN

## 2020-03-09 MED ORDER — ONDANSETRON HCL 4 MG/2ML IJ SOLN
4.0000 mg | INTRAMUSCULAR | Status: AC
  Administered 2020-03-09: 4 mg via INTRAVENOUS
  Filled 2020-03-09: qty 2

## 2020-03-09 MED ORDER — PROPOFOL 10 MG/ML IV BOLUS
INTRAVENOUS | Status: AC
  Filled 2020-03-09: qty 20

## 2020-03-09 MED ORDER — TRAMADOL HCL 50 MG PO TABS: 50.0000 mg | ORAL_TABLET | Freq: Four times a day (QID) | ORAL | Status: DC

## 2020-03-09 MED ORDER — LACTATED RINGERS IV SOLN: INTRAVENOUS | Status: DC | PRN

## 2020-03-09 MED ORDER — ONDANSETRON HCL 4 MG/2ML IJ SOLN: 4.0000 mg | Freq: Four times a day (QID) | INTRAMUSCULAR | Status: DC | PRN

## 2020-03-09 MED ORDER — SODIUM CHLORIDE 0.9 % IV BOLUS
1000.0000 mL | Freq: Once | INTRAVENOUS | Status: AC
  Administered 2020-03-09: 1000 mL via INTRAVENOUS

## 2020-03-09 MED ORDER — FENTANYL CITRATE (PF) 100 MCG/2ML IJ SOLN
INTRAMUSCULAR | Status: AC
  Filled 2020-03-09: qty 4

## 2020-03-09 SURGICAL SUPPLY — 54 items
APL PRP STRL LF DISP 70% ISPRP (MISCELLANEOUS)
BLADE SAGITTAL WIDE XTHICK NO (BLADE) IMPLANT
CANISTER SUCT 1200ML W/VALVE (MISCELLANEOUS) IMPLANT
CANISTER SUCT 3000ML PPV (MISCELLANEOUS) IMPLANT
CANISTER WOUND CARE 500ML ATS (WOUND CARE) IMPLANT
CHLORAPREP W/TINT 26 (MISCELLANEOUS) IMPLANT
COVER BACK TABLE REUSABLE LG (DRAPES) IMPLANT
COVER WAND RF STERILE (DRAPES) IMPLANT
DRAPE 3/4 80X56 (DRAPES) IMPLANT
DRAPE C-ARMOR (DRAPES) IMPLANT
DRAPE INCISE IOBAN 66X60 STRL (DRAPES) IMPLANT
DRSG EMULSION OIL 3X8 NADH (GAUZE/BANDAGES/DRESSINGS) IMPLANT
ELECT BLADE 6.5 EXT (BLADE) IMPLANT
ELECT CAUTERY BLADE 6.4 (BLADE) IMPLANT
ELECT REM PT RETURN 9FT ADLT (ELECTROSURGICAL)
ELECTRODE REM PT RTRN 9FT ADLT (ELECTROSURGICAL) IMPLANT
GLOVE BIOGEL PI IND STRL 9 (GLOVE) IMPLANT
GLOVE BIOGEL PI INDICATOR 9 (GLOVE)
GLOVE INDICATOR 8.0 STRL GRN (GLOVE) IMPLANT
GLOVE SURG ORTHO 8.0 STRL STRW (GLOVE) IMPLANT
GLOVE SURG SYN 9.0  PF PI (GLOVE)
GLOVE SURG SYN 9.0 PF PI (GLOVE) IMPLANT
GOWN SRG 2XL LVL 4 RGLN SLV (GOWNS) IMPLANT
GOWN STRL NON-REIN 2XL LVL4 (GOWNS)
GOWN STRL REUS W/ TWL LRG LVL3 (GOWN DISPOSABLE) IMPLANT
GOWN STRL REUS W/ TWL XL LVL3 (GOWN DISPOSABLE) IMPLANT
GOWN STRL REUS W/TWL LRG LVL3 (GOWN DISPOSABLE)
GOWN STRL REUS W/TWL XL LVL3 (GOWN DISPOSABLE)
HEMOSTAT SURGICEL 2X3 (HEMOSTASIS) IMPLANT
HOLDER FOLEY CATH W/STRAP (MISCELLANEOUS) IMPLANT
HOOD PEEL AWAY FLYTE STAYCOOL (MISCELLANEOUS) IMPLANT
IMMOB KNEE 14 THIGH 24 706614 (SOFTGOODS) ×3 IMPLANT
KIT PREVENA INCISION MGT 13 (CANNISTER) IMPLANT
KIT TURNOVER KIT A (KITS) IMPLANT
NDL SAFETY ECLIPSE 18X1.5 (NEEDLE) IMPLANT
NEEDLE HYPO 18GX1.5 SHARP (NEEDLE)
NS IRRIG 1000ML POUR BTL (IV SOLUTION) IMPLANT
PACK HIP PROSTHESIS (MISCELLANEOUS) IMPLANT
PULSAVAC PLUS IRRIG FAN TIP (DISPOSABLE)
SCALPEL PROTECTED #10 DISP (BLADE) IMPLANT
SOL .9 NS 3000ML IRR  AL (IV SOLUTION)
SOL .9 NS 3000ML IRR AL (IV SOLUTION)
SOL .9 NS 3000ML IRR UROMATIC (IV SOLUTION) IMPLANT
STAPLER SKIN PROX 35W (STAPLE) IMPLANT
SUT DVC 2 QUILL PDO  T11 36X36 (SUTURE)
SUT DVC 2 QUILL PDO T11 36X36 (SUTURE) IMPLANT
SUT ETHIBOND #5 BRAIDED 30INL (SUTURE) IMPLANT
SUT V-LOC 90 ABS DVC 3-0 CL (SUTURE) IMPLANT
SUT VIC AB 1 CT1 36 (SUTURE) IMPLANT
SYR 20ML LL LF (SYRINGE) IMPLANT
TIP FAN IRRIG PULSAVAC PLUS (DISPOSABLE) IMPLANT
TIP IRRIG/SUCT HIGH CAPACITY (MISCELLANEOUS) IMPLANT
TOWEL OR 17X26 4PK STRL BLUE (TOWEL DISPOSABLE) IMPLANT
TRAY FOLEY MTR SLVR 16FR STAT (SET/KITS/TRAYS/PACK) IMPLANT

## 2020-03-09 NOTE — H&P (Signed)
Subjective:   Patient is a 85 y.o. male presents with right hip pain. Onset of symptoms was abrupt starting a few hours ago with unchanged course since that time. The pain is located around the right hip.  He recently had a right hip hemiarthroplasty after traumatic fall injuring the right hip.  He had surgery a few days ago was discharged yesterday and went to go the bathroom this morning.  He was able to get to the bathroom on his way back without notifying family members he stumbled fell and had immediate deformity and pain.  He was brought to the emergency room was found to have a dislocated right hip.  Patient Active Problem List   Diagnosis Date Noted  . Dislocated hip, right, initial encounter ([place]) 03/09/2020  . Closed right hip fracture, initial encounter ([place]) 03/05/2020  . Hypertension   . Hypothyroidism   . Radiculopathy 06/27/2017   Past Medical History:  Diagnosis Date  . Anosmia   . Arm fracture    right arm  . Ataxia   . Colon polyp   . ED (erectile dysfunction)    of organic origin  . Elevated PSA   . Hypertension   . Hypothyroidism   . Legally blind in left eye, as defined in [place]   . Neuropathy    peripheral neuropathy  . Osteoarthritis   . Pneumonia 1960    Past Surgical History:  Procedure Laterality Date  . APPENDECTOMY    . CATARACT EXTRACTION  04/19/2010  . COLONOSCOPY WITH PROPOFOL N/A 01/04/2017   Procedure: COLONOSCOPY WITH PROPOFOL;  Surgeon: Lollie Sails, MD;  [facility] END[facility];  Service: Endoscopy;  Laterality: N/A;  . EYE SURGERY Left 2003   vitrectomy with TPA injection  . HIP ARTHROPLASTY Right 03/06/2020   Procedure: ARTHROPLASTY BIPOLAR HIP (HEMIARTHROPLASTY);  Surgeon: Leim Fabry, MD;  Location: [facility] ORS;  Service: Orthopedics;  Laterality: Right;  . KNEE ARTHROSCOPY Bilateral 1973, 2000, 2002   1973 left, 2000 right, 2002 right  . left hip pin Left 1999  . LUMBAR LAMINECTOMY    . rectal cauterization  1979  . TONSILLECTOMY       (Not in a hospital admission)  No Known Allergies  Social History   Tobacco Use  . Smoking status: Former Smoker    Packs/day: 0.50    Types: Cigarettes    Quit date: 1959    Years since quitting: 62.6  . Smokeless tobacco: Never Used  Substance Use Topics  . Alcohol use: No    History reviewed. No pertinent family history.  Review of Systems Pertinent items are noted in HPI.  Objective:   Patient Vitals for the past 8 hrs:  BP Temp Temp src Pulse Resp SpO2 Height Weight  03/09/20 0630 135/73 -- -- 63 19 98 % -- --  03/09/20 0600 -- -- -- 95 16 95 % -- --  03/09/20 0530 (!) 140/59 -- -- 92 11 99 % -- --  03/09/20 0432 -- -- -- 88 15 100 % -- --  03/09/20 0430 139/68 -- -- 95 19 90 % -- --  03/09/20 0422 135/62 -- -- 90 15 96 % -- --  03/09/20 0330 130/71 -- -- 93 16 -- -- --  03/09/20 0255 -- -- -- -- -- -- 6' (1.829 m) 68 kg  03/09/20 0254 (!) 148/62 99.1 F (37.3 C) Axillary (!) 101 (!) 24 100 % -- --   No intake/output data recorded. No intake/output data recorded.  BP 135/73   Pulse 63   Temp 99.1 F (37.3 C) (Axillary)   Resp 19   Ht 6' (1.829 m)   Wt 68 kg   SpO2 98%   BMI 20.33 kg/m  General appearance: appears stated age and moderate distress Lungs: clear to auscultation bilaterally Heart: regular rate and rhythm, S1, S2 normal, no murmur, click, rub or gallop His right leg is internally rotated and shortened.  He is able to flex extend the toes and sensation is intact.  Palpable pulses.  Data ReviewRadiology review: He has an uncemented right hip hemiarthroplasty that is dislocated superiorly and posteriorly.  Unable determine if there might be posterior rim fracture of the acetabulum.  Assessment:   Active Problems:   Dislocated hip, right, initial encounter [facility])   Plan:   I discussed treatment options with patient and his son.  Plan on close reduction and if hip is stable will be finished and will put him knee immobilizer on to try  to prevent repeat dislocation.  If close reduction is successful but the hip remains unstable will need to revise to a total hip.  If we are unable to successfully reduce the hip will need to do an open reduction and determination on whether it is adequately stable will be determined at that time again even if stable will leave in place if unstable will need revision to total hip.  Risks of surgery were discussed

## 2020-03-09 NOTE — ED Notes (Signed)
Pt O2 sats observed at 85% with good waveform while pt is sleeping. Pt placed on 3L [place] and improved to 99%

## 2020-03-09 NOTE — Anesthesia Postprocedure Evaluation (Signed)
Anesthesia Post Note  Patient: Robert Knight  Procedure(s) Performed: CLOSED REDUCTION HIP (Right Hip)  Patient location during evaluation: PACU Anesthesia Type: Spinal Level of consciousness: awake and alert Pain management: pain level controlled Vital Signs Assessment: post-procedure vital signs reviewed and stable Respiratory status: spontaneous breathing, nonlabored ventilation, respiratory function stable and patient connected to nasal cannula oxygen Cardiovascular status: blood pressure returned to baseline and stable Postop Assessment: no apparent nausea or vomiting Anesthetic complications: no   No complications documented.   Last Vitals:  Vitals:   03/09/20 1114 03/09/20 1422  BP: 114/63 139/73  Pulse: 77 96  Resp: 18 20  Temp:  37 C  SpO2: 95% 96%    Last Pain:  Vitals:   03/09/20 1422  TempSrc: Temporal  PainSc: 0-No pain                 Martha Clan

## 2020-03-09 NOTE — ED Notes (Signed)
Pt had a mechanical fall and has right sided inward rotation and shortening of leg. Patient pain 10/10.

## 2020-03-09 NOTE — Evaluation (Signed)
Physical Therapy Evaluation Patient Details Name: Robert Knight MRN: [id number] DOB: 03-30-1935 Today's Date: 03/09/2020   History of Present Illness  Pt is an 85 y.o. male that presented to ED after a fall at home night of discharge from hospital from previous fall. Pt status post hemiarthroplasty, and after fall at home underwent closed reduction of hip this AM. PMH of mild cognitive impairment, hypertension, and hypothyroidism, blindness in one eye, HOH.    Clinical Impression  The patient was in bed at start of session, reported no pain at rest. With mobility exhibited mild pain signs/symptoms. Reviewed posterior hip precautions with pt and family 3 times during session, as well as KI on RLE, per MD to be worn at all times. Pt performed supine to sit with minA for RLE. And sit <> Stand several times. Cueing for RLE positioning and use of UE, stabilization of RW necessary for initial transfers. The patient ambulated ~152ft with RW and CGA, needed assistance with RW advancement (pt tended to lift walker or ambulate too closely) but after education was able to perform without tactile cueing.  Overall the patient demonstrated deficits (see "PT Problem List") that impede the patient's functional abilities, safety, and mobility and would benefit from skilled PT intervention. Recommendation is HHPT with supervision for all mobility/OOB attempts, family and pt aware.      Follow Up Recommendations Home health PT;Supervision for mobility/OOB    Equipment Recommendations  None recommended by PT    Recommendations for Other Services       Precautions / Restrictions Precautions Precautions: Fall;Posterior Hip Required Braces or Orthoses: Knee Immobilizer - Right Knee Immobilizer - Right: On at all times Restrictions Weight Bearing Restrictions: Yes RLE Weight Bearing: Weight bearing as tolerated      Mobility  Bed Mobility Overal bed mobility: Needs Assistance Bed Mobility: Supine to Sit      Supine to sit: Min assist;HOB elevated     General bed mobility comments: minA for RLE management  Transfers Overall transfer level: Needs assistance Equipment used: Rolling walker (2 wheeled) Transfers: Sit to/from Stand Sit to Stand: Min guard         General transfer comment: VCs for hand placement, MIN A for RW stabilizing   Ambulation/Gait Ambulation/Gait assistance: Min guard Gait Distance (Feet): 120 Feet Assistive device: Rolling walker (2 wheeled) Gait Pattern/deviations: Step-through pattern;Decreased step length - right;Decreased step length - left     General Gait Details: initially assessed with RW forward progression, pt able to perform after assistance without tactile cueing  Stairs            Wheelchair Mobility    Modified Rankin (Stroke Patients Only)       Balance Overall balance assessment: Needs assistance Sitting-balance support: No upper extremity supported Sitting balance-Leahy Scale: Good     Standing balance support: Bilateral upper extremity supported Standing balance-Leahy Scale: Fair                               Pertinent Vitals/Pain Pain Assessment: Faces Faces Pain Scale: Hurts a little bit Pain Location: R hip with transitional movements    Home Living Family/patient expects to be discharged to:: Private residence Living Arrangements: Spouse/significant other (daughter flying in to stay with patient) Available Help at Discharge: Family;Available 24 hours/day Type of Home: House Home Access: Stairs to enter   [employer] of Steps: 1 Home Layout: One level Home Equipment: Walker - 4  wheels;Shower seat      Prior Function Level of Independence: Independent with assistive device(s)         Comments: Patient had largely been independent with ambulation, occasional use of AD in the past. Reports he was walking more than a mile a day.     Hand Dominance   Dominant Hand: Right     Extremity/Trunk Assessment   Upper Extremity Assessment Upper Extremity Assessment: Overall WFL for tasks assessed    Lower Extremity Assessment Lower Extremity Assessment: RLE deficits/detail;LLE deficits/detail RLE Deficits / Details: s/p recent hip hemiarthroplasty, and closed reduction       Communication   Communication: HOH  Cognition Arousal/Alertness: Awake/alert Behavior During Therapy: WFL for tasks assessed/performed Overall Cognitive Status: Within Functional Limits for tasks assessed                                        General Comments General comments (skin integrity, edema, etc.): spO2 99% during ambulation    Exercises     Assessment/Plan    PT Assessment Patient needs continued PT services  PT Problem List Decreased strength;Decreased mobility;Decreased safety awareness;Decreased range of motion;Decreased activity tolerance;Cardiopulmonary status limiting activity;Decreased balance;Decreased knowledge of use of DME;Decreased knowledge of precautions;Pain       PT Treatment Interventions DME instruction;Therapeutic exercise;Gait training;Balance training;Neuromuscular re-education;Stair training;Cognitive remediation;Therapeutic activities;Patient/family education    PT Goals (Current goals can be found in the Care Plan section)  Acute Rehab PT Goals Patient Stated Goal: to go home safely PT Goal Formulation: With patient Time For Goal Achievement: 03/23/20 Potential to Achieve Goals: Good    Frequency BID   Barriers to discharge        Co-evaluation               AM-PAC PT "6 Clicks" Mobility  Outcome Measure Help needed turning from your back to your side while in a flat bed without using bedrails?: A Little Help needed moving from lying on your back to sitting on the side of a flat bed without using bedrails?: A Little Help needed moving to and from a bed to a chair (including a wheelchair)?: A Little Help needed  standing up from a chair using your arms (e.g., wheelchair or bedside chair)?: A Little Help needed to walk in hospital room?: A Little Help needed climbing 3-5 steps with a railing? : A Little 6 Click Score: 18    End of Session Equipment Utilized During Treatment: Gait belt Activity Tolerance: Patient tolerated treatment well Patient left: in chair;with family/visitor present;with nursing/sitter in room Nurse Communication: Mobility status PT Visit Diagnosis: Difficulty in walking, not elsewhere classified (R26.2);Muscle weakness (generalized) (M62.81)    Time: 0383-3383 PT Time Calculation (min) (ACUTE ONLY): 34 min   Charges:   PT Evaluation $PT Eval Moderate Complexity: 1 Mod PT Treatments $Therapeutic Exercise: 8-22 mins        Lieutenant Diego PT, DPT 1:03 PM,03/09/20

## 2020-03-09 NOTE — Transfer of Care (Signed)
Immediate Anesthesia Transfer of Care Note  Patient: Robert Knight  Procedure(s) Performed: CLOSED REDUCTION HIP (Right Hip)  Patient Location: PACU  Anesthesia Type:MAC  Level of Consciousness: awake, alert  and oriented  Airway & Oxygen Therapy: Patient Spontanous Breathing and Patient connected to face mask oxygen  Post-op Assessment: Report given to RN and Post -op Vital signs reviewed and stable  Post vital signs: Reviewed and stable  Last Vitals:  Vitals Value Taken Time  BP    Temp    Pulse 78 03/09/20 0822  Resp 12 03/09/20 0822  SpO2 95 % 03/09/20 0822  Vitals shown include unvalidated device data.  Last Pain:  Vitals:   03/09/20 0716  TempSrc:   PainSc: 0-No pain         Complications: No complications documented.

## 2020-03-09 NOTE — Discharge Instructions (Addendum)
Wear knee immobilizer at all times. Resume all prior medications.    Hip Dislocation  Hip dislocation is when the top of the thigh bone (femur) moves out of its normal place in your hip socket. To treat this, your doctor must move your bone back into place (reduction). Hip dislocation may involve a broken (fractured) bone. It can also involve damage to:  Nerves.  Tissues that connect bones together (ligaments).  Tissues that connect muscles to bones (tendons). This condition is an emergency. If you think you have dislocated your hip, do not move. Get medical help right away. What are the causes?  A hard, direct hit to a bent knee. This is often caused by a car crash.  This injury may also be caused by: ? Falls. ? Contact sports. ? Industrial accidents. What increases the risk?  Having a man-made (prosthetic) hip.  Being overweight.  Not having hip strength and ease of movement (flexibility).  Having a problem in the hip joint that was present at birth (congenital).  Having a condition that causes you to be at an increased risk for falling.  Playing contact sports. What are the signs or symptoms?  Very bad pain in your hip area. Pain may get worse when you move or when you try to use your hip to support (bear) your weight.  Not being able to move your hip.  The leg on the side of the injured hip looks shorter than your other leg.  Inward turning of the foot on the side of the injured hip.  Loss of feeling in your lower leg, foot, or ankle. How is this treated?  This condition is treated by moving the top of the thigh bone back into place in your hip socket. Your doctor may move it into place by hand (closed reduction) in a hospital or clinic. In some cases, you may need to have surgery to move the bone back into the hip socket (surgical reduction). ? Your doctor may perform surgery if:  You have a broken bone.  You have pieces (fragments) of bone in your  joint.  You have damaged blood vessels or nerves.  Closed reduction was not successful. ? After your reduction, treatment may include:  Crutches.  A splint.  Physical therapy to help strengthen the muscles that hold your hip joint in place. Follow these instructions at home: Medicines  Take over-the-counter and prescription medicines only as told by your doctor.  Ask your doctor if the medicine prescribed to you: ? Requires you to avoid driving or using heavy machinery. ? Can cause trouble pooping (constipation). You may need to take actions to prevent or treat trouble pooping:  Drink enough fluid to keep your pee (urine) pale yellow.  Take over-the-counter or prescription medicines.  Eat foods that are high in fiber. These include beans, whole grains, and fresh fruits and vegetables.  Limit foods that are high in fat and sugar. These include fried or sweet foods. If you have a splint:  Do not put pressure on any part of the splint until it is fully hardened. This may take many hours.  Wear it as told by your doctor. Remove it only as told by your doctor.  Loosen it if your toes: ? Tingle. ? Get numb. ? Turn cold and blue.  Keep it clean and dry.  Check the skin around it every day. Tell your doctor about any concerns. Bathing  If the splint is not waterproof: ? Do not let it  get wet. ? Cover it with a watertight covering when you take a bath or shower.  Do not take baths, swim, or use a hot tub until your doctor approves. Ask your doctor if you may take showers. You may only be allowed to take sponge baths. Managing pain, stiffness, and swelling   If told, put ice on the injured area: ? Put ice in a plastic bag. ? Place a towel between your skin and the bag. ? Leave the ice on for 20 minutes, 2-3 times a day.  Wear compression stockings or wraps as told by your doctor.  Move your toes often. Activity  Do not use your hip to bear weight until your doctor  says that you can. Use crutches or a walker as told by your doctor.  Do not lift anything that is heavier than 10 lb (4.5 kg), or the limit that you are told, until your doctor says that it is safe.  Return to your normal activities as told by your doctor. Ask your doctor what activities are safe for you.  If physical therapy was prescribed, do exercises as told by your doctor. Driving  Ask your doctor when it is safe to drive if you have a splint on your hip.  Do not drive for 24 hours if you were given a medicine to help you relax (sedative). General instructions  Do not use any products that contain nicotine or tobacco, such as cigarettes, e-cigarettes, and chewing tobacco. These can delay bone healing. If you need help quitting, ask your doctor. ? Keep all follow-up visits as told by your doctor. This is important. Contact a doctor if you have:  Pain that gets worse.  Pain that does not get better with medicine.  Swelling in your hip area or your leg.  Red skin on your hip area or your leg.  Tingling in any part of your hip, leg, or foot. Get help right away if you:  Feel like your hip has dislocated again.  Have very bad pain in your hip or groin.  Have numbness or weakness in your leg.  Cannot move any part of your hip or leg. If you have symptoms of a hip dislocation, do not wait to see if the symptoms will go away. Get medical help right away. Call your local emergency services (911 in the [place] Do not drive yourself to the hospital. Summary  Hip dislocation is when the bones in your hip joint move out of place.  This condition is often caused by a hard, direct hit to a bent knee.  This condition is treated by moving the top of the thigh bone back into your hip socket (reduction).  Hip dislocation is corrected by closed reduction or by surgery. This information is not intended to replace advice given to you by your health care provider. Make sure you discuss any  questions you have with your health care provider. Document Revised: 04/17/2018 Document Reviewed: 04/18/2018 Elsevier Patient Education  2020 [place]   1) The drugs that you were given will stay in your system until tomorrow so for the next 24 hours you should not:  A) Drive an automobile B) Make any legal decisions C) Drink any alcoholic beverage   2) You may resume regular meals tomorrow.  Today it is better to start with liquids and gradually work up to solid foods.  You may eat anything you prefer, but it is better to  start with liquids, then soup and crackers, and gradually work up to solid foods.   3) Please notify your doctor immediately if you have any unusual bleeding, trouble breathing, redness and pain at the surgery site, drainage, fever, or pain not relieved by medication.    4) Additional Instructions:        Please contact your physician with any problems or Same Day Surgery at [phone], Monday through Friday 6 am to 4 pm, or [place] [facility] number at [phone].

## 2020-03-09 NOTE — Anesthesia Procedure Notes (Signed)
Date/Time: 03/09/2020 8:12 AM Performed by: Nelda Marseille, CRNA Pre-anesthesia Checklist: Patient identified, Emergency Drugs available, Suction available, Patient being monitored and Timeout performed Oxygen Delivery Method: Simple face mask

## 2020-03-09 NOTE — Progress Notes (Signed)
PT Cancellation Note  Patient Details Name: Robert Knight MRN: [id number] DOB: 1934/08/22   Cancelled Treatment:    Reason Eval/Treat Not Completed: Other (comment). PT spoke with RN, per RN pt sleeping, PT communicated need for weight bearing orders and removal of bed rest orders prior to PT evaluation, RN to follow up with MD.    Lieutenant Diego PT, DPT 10:29 AM,03/09/20

## 2020-03-09 NOTE — ED Notes (Signed)
MD Rudene Christians at bedside at this time.

## 2020-03-09 NOTE — Anesthesia Preprocedure Evaluation (Addendum)
Anesthesia Evaluation  Patient identified by MRN, date of birth, ID band Patient awake    Reviewed: Allergy & Precautions, H&P , NPO status , Patient's Chart, lab work & pertinent test results  History of Anesthesia Complications Negative for: history of anesthetic complications  Airway Mallampati: II  TM Distance: >3 FB     Dental  (+) Chipped   Pulmonary neg pulmonary ROS, neg COPD, Not current smoker, former smoker,    breath sounds clear to auscultation       Cardiovascular hypertension, (-) angina(-) Past MI (-) dysrhythmias (-) Valvular Problems/Murmurs Rhythm:regular Rate:Normal     Neuro/Psych H/o lumbar laminectomy negative neurological ROS  negative psych ROS   GI/Hepatic negative GI ROS, Neg liver ROS,   Endo/Other  neg diabetesHypothyroidism   Renal/GU      Musculoskeletal   Abdominal   Peds  Hematology negative hematology ROS (+)   Anesthesia Other Findings Past Medical History: No date: Anosmia No date: Arm fracture     Comment:  right arm No date: Ataxia No date: Colon polyp No date: ED (erectile dysfunction)     Comment:  of organic origin No date: Elevated PSA No date: Hypertension No date: Hypothyroidism No date: Legally blind in left eye, as defined in [place] No date: Neuropathy     Comment:  peripheral neuropathy No date: Osteoarthritis 1960: Pneumonia  Past Surgical History: No date: APPENDECTOMY 04/19/2010: CATARACT EXTRACTION 01/04/2017: COLONOSCOPY WITH PROPOFOL; N/A     Comment:  Procedure: COLONOSCOPY WITH PROPOFOL;  Surgeon:               Lollie Sails, MD;  Location: [facility] ENDOSCOPY;                Service: Endoscopy;  Laterality: N/A; 2003: EYE SURGERY; Left     Comment:  vitrectomy with TPA injection 1973, 2000, 2002: KNEE ARTHROSCOPY; Bilateral     Comment:  1973 left, 2000 right, 2002 right 1999: left hip pin; Left No date: LUMBAR LAMINECTOMY 1979: rectal  cauterization No date: TONSILLECTOMY  BMI    Body Mass Index: 20.61 kg/m    Patient is s/p hip hemiarthroplasty for hip fracture who is now presenting with a dislocation.  Surgeon plan to do a closed reduction and if it doesn't stay reduced he will revise the arthroplasty.  Patient does seem to have some altered mental status this morning.  Reproductive/Obstetrics negative OB ROS                            Anesthesia Physical  Anesthesia Plan  ASA: II  Anesthesia Plan: Spinal and General   Post-op Pain Management:    Induction:   PONV Risk Score and Plan: Propofol infusion and TIVA  Airway Management Planned: Simple Face Mask  Additional Equipment:   Intra-op Plan:   Post-operative Plan:   Informed Consent: I have reviewed the patients History and Physical, chart, labs and discussed the procedure including the risks, benefits and alternatives for the proposed anesthesia with the patient or authorized representative who has indicated his/her understanding and acceptance.     Dental Advisory Given  Plan Discussed with: Anesthesiologist, CRNA and Surgeon  Anesthesia Plan Comments: (Given the surgical plan we will plan on doing a TIVA for the closed reduction and if the surgeon needs to progress to a revision of the hemiarthroplasty we will emerge the patient and plan to do a spinal anesthetic given how well he did with  his prior neuraxial anesthetic.)        Anesthesia Quick Evaluation

## 2020-03-09 NOTE — ED Provider Notes (Signed)
[facility] Emergency Department Provider Note  ____________________________________________   First MD Initiated Contact with Patient 03/09/20 0250     (approximate)  I have reviewed the triage vital signs and the nursing notes.   HISTORY  Chief Complaint Fall and Hip Pain  Level 5 caveat: Patient's history is limited by what his son describes as chronic aphasia and possible dementia  HPI Robert Knight is a 85 y.o. male whose medical history as listed below notably includes some chronic aphasia, possible dementia, and right hip hemiarthroplasty by Dr. Leim Fabry about 3 days ago.  He presents for acute onset pain in the right hip and obvious deformity after a fall.  His son is at bedside and reports that the patient was supposed to let the family know before he gets up and goes to the bathroom, but he got up during the night.  He went to the bathroom successfully but on the way back to his bed he somehow got tripped up and fell, landing on the right hip.  He had immediate onset of acute and severe pain.  No numbness nor tingling.  He denies chest pain or shortness of breath but is not able to provide much history due to pain as well as his chronic aphasia.         Past Medical History:  Diagnosis Date  . Anosmia   . Arm fracture    right arm  . Ataxia   . Colon polyp   . ED (erectile dysfunction)    of organic origin  . Elevated PSA   . Hypertension   . Hypothyroidism   . Legally blind in left eye, as defined in [place]   . Neuropathy    peripheral neuropathy  . Osteoarthritis   . Pneumonia 1960    Patient Active Problem List   Diagnosis Date Noted  . Closed right hip fracture, initial encounter ([place]) 03/05/2020  . Hypertension   . Hypothyroidism   . Radiculopathy 06/27/2017    Past Surgical History:  Procedure Laterality Date  . APPENDECTOMY    . CATARACT EXTRACTION  04/19/2010  . COLONOSCOPY WITH PROPOFOL N/A 01/04/2017   Procedure:  COLONOSCOPY WITH PROPOFOL;  Surgeon: Lollie Sails, MD;  Location: [facility] ENDOSCOPY;  Service: Endoscopy;  Laterality: N/A;  . EYE SURGERY Left 2003   vitrectomy with TPA injection  . HIP ARTHROPLASTY Right 03/06/2020   Procedure: ARTHROPLASTY BIPOLAR HIP (HEMIARTHROPLASTY);  Surgeon: Leim Fabry, MD;  Location: [facility] ORS;  Service: Orthopedics;  Laterality: Right;  . KNEE ARTHROSCOPY Bilateral 1973, 2000, 2002   1973 left, 2000 right, 2002 right  . left hip pin Left 1999  . LUMBAR LAMINECTOMY    . rectal cauterization  1979  . TONSILLECTOMY      Prior to Admission medications   Medication Sig Start Date End Date Taking? Authorizing Provider  aspirin EC 81 MG tablet Take 81 mg by mouth daily.   Yes [provider]  Cholecalciferol 2000 units CAPS Take 2,000 Units by mouth daily.    Yes [provider]  Coenzyme Q10 (CO Q-10) 30 MG CAPS Take 30 mg by mouth daily.    Yes [provider]  enoxaparin (LOVENOX) 40 MG/0.4ML injection Inject 0.4 mLs (40 mg total) into the skin daily for 14 days. 03/07/20 03/21/20 Yes Reche Dixon, PA-C  fluticasone (FLONASE) 50 MCG/ACT nasal spray Place 1 spray into both nostrils 2 (two) times daily as needed for allergies or rhinitis.  Yes [provider]  levothyroxine (SYNTHROID, LEVOTHROID) 75 MCG tablet Take 75 mcg by mouth daily before breakfast. Daily on an empty stomach with a glass of water at least 30 to 60 minutes before meal   Yes [provider]  lisinopril (PRINIVIL,ZESTRIL) 10 MG tablet Take 10 mg by mouth daily.   Yes [provider]  Multiple Vitamin (MULTIVITAMIN) tablet Take 1 tablet by mouth daily.   Yes [provider]  Multiple Vitamins-Minerals (PRESERVISION AREDS 2 PO) Take 1 tablet by mouth in the morning and at bedtime.    Yes [provider]  Omega 3-6-9 Fatty Acids (OMEGA 3-6-9 COMPLEX PO) Take 1 capsule by mouth daily.    Yes [provider]  ondansetron  (ZOFRAN) 4 MG tablet Take 1 tablet (4 mg total) by mouth every 6 (six) hours as needed for nausea. 03/07/20  Yes Reche Dixon, PA-C  oxyCODONE (OXY IR/ROXICODONE) 5 MG immediate release tablet Take 1 tablet (5 mg total) by mouth every 6 (six) hours as needed for severe pain (pain score 7-10). 03/07/20  Yes Reche Dixon, PA-C  sildenafil (REVATIO) 20 MG tablet Take 20-40 mg by mouth daily as needed (erectile dysfunction).    Yes [provider]  traMADol (ULTRAM) 50 MG tablet Take 1 tablet (50 mg total) by mouth every 6 (six) hours as needed for moderate pain. 03/07/20  Yes Reche Dixon, PA-C    Allergies Patient has no known allergies.  History reviewed. No pertinent family history.  Social History Social History   Tobacco Use  . Smoking status: Former Smoker    Packs/day: 0.50    Types: Cigarettes    Quit date: 1959    Years since quitting: 62.6  . Smokeless tobacco: Never Used  Vaping Use  . Vaping Use: Never used  Substance Use Topics  . Alcohol use: No  . Drug use: No    Review of Systems Level 5 caveat: Patient's history is limited by what his son describes as chronic aphasia and possible dementia   ____________________________________________   PHYSICAL EXAM:  VITAL SIGNS: ED Triage Vitals  Enc Vitals Group     BP 03/09/20 0254 (!) 148/62     Pulse Rate 03/09/20 0254 (!) 101     Resp 03/09/20 0254 (!) 24     Temp 03/09/20 0254 99.1 F (37.3 C)     Temp Source 03/09/20 0254 Axillary     SpO2 03/09/20 0254 100 %     Weight 03/09/20 0255 68 kg (149 lb 14.6 oz)     Height 03/09/20 0255 1.829 m (6')     Head Circumference --      Peak Flow --      Pain Score 03/09/20 0255 10     Pain Loc --      Pain Edu? --      Excl. in [place]? --     Constitutional: Awake and alert, significant pain. Eyes: Conjunctivae are normal.  Head: Atraumatic. Nose: No congestion/rhinnorhea. Mouth/Throat: Patient is wearing a mask. Neck: No stridor.  No meningeal signs.     Cardiovascular: Mild tachycardia, regular rhythm. Good peripheral circulation. Grossly normal heart sounds. Respiratory: Normal respiratory effort.  No retractions. Gastrointestinal: Soft and nontender. No distention.  Musculoskeletal: Internally rotated and shortened right lower extremity.  Severe pain with any attempt at moving the extremity. Neurologic: Delayed speech and language. No gross focal neurologic deficits are appreciated.  Skin:  Skin is warm, dry and intact.  Postoperative dressing is  still in place on the right lateral hip.   ____________________________________________   LABS (all labs ordered are listed, but only abnormal results are displayed)  Labs Reviewed  CBC WITH DIFFERENTIAL/PLATELET - Abnormal; Notable for the following components:      Result Value   RBC 3.54 (*)    Hemoglobin 11.0 (*)    HCT 32.2 (*)    All other components within normal limits  BASIC METABOLIC PANEL - Abnormal; Notable for the following components:   CO2 21 (*)    Glucose, Bld 122 (*)    BUN 26 (*)    Calcium 8.1 (*)    All other components within normal limits  SARS CORONAVIRUS 2 BY RT PCR (HOSPITAL ORDER, [place] [facility])  PROTIME-INR   ____________________________________________  EKG  ED ECG REPORT I, Hinda Kehr, the attending physician, personally viewed and interpreted this ECG.  Date: 03/09/2020 EKG Time: 2:51 AM Rate: 99 Rhythm: normal sinus rhythm QRS Axis: normal Intervals: Right bundle branch block ST/T Wave abnormalities: Non-specific ST segment / T-wave changes, but no clear evidence of acute ischemia. Narrative Interpretation: no definitive evidence of acute ischemia; does not meet STEMI criteria.   ____________________________________________  RADIOLOGY I, Hinda Kehr, personally viewed and evaluated these images (plain radiographs) as part of my medical decision making, as well as reviewing the written report by the  radiologist.  ED MD interpretation: Unremarkable chest x-ray.  Superior and lateral dislocation of the femoral component of the right hip hemiarthroplasty.  Official radiology report(s): DG Chest 1 View  Result Date: 03/09/2020 CLINICAL DATA:  Right hip pain and deformity after a fall. EXAM: CHEST  1 VIEW COMPARISON:  None. FINDINGS: Normal heart size and pulmonary vascularity. Emphysematous changes and slight fibrosis suggested in the lungs. No focal consolidation or edema. No pleural effusions. No pneumothorax. Mediastinal contours appear intact. Calcification of the aorta. IMPRESSION: Emphysematous changes in the lungs. No evidence of active pulmonary disease. Electronically Signed   By: Lucienne Capers M.D.   On: 03/09/2020 04:21   DG Hip Unilat W or Wo Pelvis 2-3 Views Right  Result Date: 03/09/2020 CLINICAL DATA:  Right hip pain and deformity after a fall. Surgery about 4 days ago. EXAM: DG HIP (WITH OR WITHOUT PELVIS) 2-3V RIGHT COMPARISON:  03/06/2020 FINDINGS: Postoperative changes with a previous right hip hemiarthroplasty using non cemented component. Since the previous study, there is interval superior and lateral dislocation of the femoral component with respect to the acetabulum. No fractures are identified. Skin clips are consistent with recent surgery. Incidental note of postoperative changes in the lower lumbar spine and in the left hip. IMPRESSION: Interval dislocation of right hip hemiarthroplasty. Electronically Signed   By: Lucienne Capers M.D.   On: 03/09/2020 04:19    ____________________________________________   PROCEDURES   Procedure(s) performed (including Critical Care):  .1-3 Lead EKG Interpretation Performed by: Hinda Kehr, MD Authorized by: Hinda Kehr, MD     Interpretation: non-specific     ECG rate:  100   ECG rate assessment: tachycardic     Rhythm: sinus tachycardia     Ectopy: none     Conduction: normal        ____________________________________________   INITIAL IMPRESSION / MDM / ASSESSMENT AND PLAN / ED COURSE  As part of my medical decision making, I reviewed the following data within the [place] History obtained from family, Nursing notes reviewed and incorporated, Labs reviewed , EKG interpreted , Old chart reviewed,  Radiograph reviewed , A consult was requested and obtained from this/these consultant(s) Orthopedics and Notes from prior ED visits   Differential diagnosis includes, but is not limited to, hip dislocation, hip fracture including disruption of the existing hardware, much less likely postoperative infection.  The patient is on the cardiac monitor to evaluate for evidence of arrhythmia and/or significant heart rate changes.  Imaging is pending but I suspect the hip is dislocated.  Basic lab work is also pending anticipating the patient may need operative intervention.  He is in severe pain and I ordered Dilaudid 1 mg IV and Zofran 4 mg IV.      Clinical Course as of Mar 09 517  Tue Mar 09, 2020  0437 Superior and lateral hip dislocation of the recent hemiarthroplasty without acetabular component.  Paging Dr. Rudene Christians to discuss.  The patient is currently much more comfortable and says his leg does not hurt now.  His adult son is at bedside and I updated him about the results.  DG Hip Unilat W or Wo Pelvis 2-3 Views Right [CF]  [phone] Spoke by phone with Dr. Rudene Christians with orthopedics.  He is going to review the patient's chart and imaging and call me back.   [CF]  [phone] Dr. Rudene Christians contacted me and let me know that he feels it would be safest for the patient to reduce him in the operating room.  That way if there are any complications or difficulty getting the joint back in place they can convert to an open procedure.  I updated the patient and his son.  He is not in pain at this time.  I originally had ordered a 1 L fluid bolus in case he would require procedural  sedation so that he had adequate preload, but I have asked the nurse to change the bolus to a 100 mL/h infusion.  The patient will remain n.p.o.  Dr. Rudene Christians said that he or one of his colleagues would bring the patient to the operating room directly from the emergency department rather than being admitted to the hospital first.   [CF]  0517 Of note, CBC and basic metabolic panel are essentially normal, as are coagulation studies.   [CF]    Clinical Course User Index [CF] Hinda Kehr, MD     ____________________________________________  FINAL CLINICAL IMPRESSION(S) / ED DIAGNOSES  Final diagnoses:  Closed dislocation of right hip, initial encounter [facility])     MEDICATIONS GIVEN DURING THIS VISIT:  Medications  morphine 4 MG/ML injection 4 mg (has no administration in time range)  HYDROmorphone (DILAUDID) injection 1 mg (1 mg Intravenous Given 03/09/20 0316)  ondansetron (ZOFRAN) injection 4 mg (4 mg Intravenous Given 03/09/20 0317)  sodium chloride 0.9 % bolus 1,000 mL (0 mLs Intravenous Stopped 03/09/20 0517)     ED Discharge Orders    None      *Please note:  Robert Knight was evaluated in Emergency Department on 03/09/2020 for the symptoms described in the history of present illness. He was evaluated in the context of the global COVID-19 pandemic, which necessitated consideration that the patient might be at risk for infection with the SARS-CoV-2 virus that causes COVID-19. Institutional protocols and algorithms that pertain to the evaluation of patients at risk for COVID-19 are in a state of rapid change based on information released by regulatory bodies including the CDC and federal and state organizations. These policies and algorithms were followed during the patient's care in the ED.  Some ED evaluations and  interventions may be delayed as a result of limited staffing during and after the pandemic.*  Note:  This document was prepared using Dragon voice recognition software and may  include unintentional dictation errors.   Hinda Kehr, MD 03/09/20 [phone]

## 2020-03-09 NOTE — ED Triage Notes (Addendum)
Pt arrival via [facility] from home due to fall and obvious hip dislocation. Patient had a fall on the 7/30 and broke his right hip and then had surgery to replace it. Patient is two days post op and attempted to walk to the bathroom when he fell again. Patient has inward rotation of leg and shortening. Patient was given 150 mcg's of fentanyl in route.   Pt is on blood thinners. Pt is unsure if he hit head during the fall.

## 2020-03-09 NOTE — Op Note (Signed)
03/09/2020  8:15 AM  PATIENT:  Robert Knight  85 y.o. male  PRE-OPERATIVE DIAGNOSIS: Dislocated right hip hemiarthroplasty  POST-OPERATIVE DIAGNOSIS:   Same  PROCEDURE: Closed reduction right hip hemiarthroplasty  SURGEON: Laurene Footman, MD  ASSISTANTS: None  ANESTHESIA:   MAC  EBL:  Total I/O In: 200 [I.V.:200] Out: -   BLOOD ADMINISTERED:none  DRAINS: none   LOCAL MEDICATIONS USED:  NONE  SPECIMEN:  No Specimen  DISPOSITION OF SPECIMEN:  N/A  COUNTS:  NO Closed procedure no count required  TOURNIQUET:  * No tourniquets in log *  IMPLANTS: None  DICTATION: .Dragon Dictation patient was brought to the operating room and after adequate anesthesia was obtained appropriate patient identification and timeout procedures were completed with fluoroscopy to help guide the hip the hip was brought up into flexion with traction and palpable reduction obtained.  At 90 degrees of flexion the hip was stable posteriorly with posterior pressure and with flexion and 90 degrees and internal rotation to 45 degrees the hip was still stable.  There appeared to be a concentric reduction.  Permanent C arm view obtained and short knee immobilizer applied to prevent knee flexion and potential repeat dislocation.  PLAN OF CARE: Discharge to home after PACU  PATIENT DISPOSITION:  PACU - hemodynamically stable.

## 2020-03-10 ENCOUNTER — Ambulatory Visit
Admission: RE | Admit: 2020-03-10 | Discharge: 2020-03-10 | Disposition: A | Discharge status: Home / Self Care | Payer: Medicare HMO | Source: Ambulatory Visit | Attending: Internal Medicine | Admitting: Internal Medicine

## 2020-03-10 ENCOUNTER — Encounter: Payer: Self-pay | Admitting: Orthopedic Surgery

## 2020-03-10 ENCOUNTER — Other Ambulatory Visit (HOSPITAL_COMMUNITY): Payer: Self-pay | Admitting: Internal Medicine

## 2020-03-10 ENCOUNTER — Other Ambulatory Visit: Payer: Self-pay | Admitting: Internal Medicine

## 2020-03-10 DIAGNOSIS — R600 Localized edema: Secondary | ICD-10-CM | POA: Insufficient documentation

## 2020-03-26 ENCOUNTER — Ambulatory Visit (INDEPENDENT_AMBULATORY_CARE_PROVIDER_SITE_OTHER): Payer: Medicare HMO | Admitting: Urology

## 2020-03-26 ENCOUNTER — Other Ambulatory Visit: Payer: Self-pay

## 2020-03-26 ENCOUNTER — Encounter: Payer: Self-pay | Admitting: Urology

## 2020-03-26 VITALS — BP 122/72 | HR 83 | Ht 76.0 in | Wt 152.0 lb

## 2020-03-26 DIAGNOSIS — R351 Nocturia: Secondary | ICD-10-CM

## 2020-03-26 DIAGNOSIS — R972 Elevated prostate specific antigen [PSA]: Secondary | ICD-10-CM | POA: Diagnosis not present

## 2020-03-26 LAB — BLADDER SCAN AMB NON-IMAGING

## 2020-03-26 MED ORDER — MIRABEGRON ER 25 MG PO TB24: 25.0000 mg | ORAL_TABLET | Freq: Every day | ORAL | 11 refills | Status: DC

## 2020-03-26 NOTE — Patient Instructions (Signed)
Mirabegron extended-release tablets What is this medicine? MIRABEGRON (MIR a BEG ron) is used to treat overactive bladder. This medicine reduces the amount of bathroom visits. It may also help to control wetting accidents. It may be used alone, but sometimes may be given with other treatments. This medicine may be used for other purposes; ask your health care provider or pharmacist if you have questions. COMMON BRAND NAME(S): Myrbetriq What should I tell my health care provider before I take this medicine? They need to know if you have any of these conditions:  high blood pressure  kidney disease  liver disease  problems urinating  prostate disease  an unusual or allergic reaction to mirabegron, other medicines, foods, dyes, or preservatives  pregnant or trying to get pregnant  breast-feeding How should I use this medicine? Take this medicine by mouth with a glass of water. Follow the directions on the prescription label. Do not cut, crush or chew this medicine. You can take it with or without food. If it upsets your stomach, take it with food. Take your medicine at regular intervals. Do not take it more often than directed. Do not stop taking except on your doctor's advice. Talk to your pediatrician regarding the use of this medicine in children. Special care may be needed. Overdosage: If you think you have taken too much of this medicine contact a poison control center or emergency room at once. NOTE: This medicine is only for you. Do not share this medicine with others. What if I miss a dose? If you miss a dose, take it as soon as you can. If it is almost time for your next dose, take only that dose. Do not take double or extra doses. What may interact with this medicine?  codeine  desipramine  digoxin  flecainide  MAOIs like Carbex, Eldepryl, Marplan, Nardil, and Parnate  methadone  metoprolol  pimozide  propafenone  thioridazine  warfarin This list may not  describe all possible interactions. Give your health care provider a list of all the medicines, herbs, non-prescription drugs, or dietary supplements you use. Also tell them if you smoke, drink alcohol, or use illegal drugs. Some items may interact with your medicine. What should I watch for while using this medicine? Visit your doctor or health care professional for regular checks on your progress. Check your blood pressure as directed. Ask your doctor or health care professional what your blood pressure should be and when you should contact him or her. You may need to limit your intake of tea, coffee, caffeinated sodas, or alcohol. These drinks may make your symptoms worse. What side effects may I notice from receiving this medicine? Side effects that you should report to your doctor or health care professional as soon as possible:  allergic reactions like skin rash, itching or hives, swelling of the face, lips, or tongue  high blood pressure  fast, irregular heartbeat  redness, blistering, peeling or loosening of the skin, including inside the mouth  signs of infection like fever or chills; pain or difficulty passing urine  trouble passing urine or change in the amount of urine Side effects that usually do not require medical attention (report to your doctor or health care professional if they continue or are bothersome):  constipation  dry mouth  headache  runny nose  stomach upset This list may not describe all possible side effects. Call your doctor for medical advice about side effects. You may report side effects to FDA at 1-800-FDA-1088. Where should   I keep my medicine? Keep out of the reach of children. Store at room temperature between 15 and 30 degrees C (59 and 86 degrees F). Throw away any unused medicine after the expiration date. NOTE: This sheet is a summary. It may not cover all possible information. If you have questions about this medicine, talk to your doctor,  pharmacist, or health care provider.  2020 Elsevier/Gold Standard (2016-12-14 11:33:21)  

## 2020-03-26 NOTE — Progress Notes (Signed)
03/26/2020 10:12 AM   Robert Knight 11-Nov-1934 [id number]  Referring provider: Kirk Ruths, MD [place] [facility] [place],  [place] [address]  Chief Complaint  Patient presents with   Nocturia    HPI:   F/u -   Robert Knight was seen today for increased not sleeping at night. He has sleep apnea test coming up. He underwent a right hip hemiarthroplasty in July 2021 and then had to undergo reduction of a dislocation last month. He is doing well with PT. Ambulating. Bowels regular. PVR 1 ml. UA clear.   He was seen June 2021 for elevated PSA. PSA was 5.3 on June 2021.  PSA was 4.10 November 2014. No FH PCa. No prior bx. No h/o BPH. He has noc x 1 in the early. He has a good stream. He has ED.  he had right greater than left asymmetric prostate on exam with all landmarks preserved.  Patient and wife continued watchful waiting.  Neurogenic risk includes dementia.   PMH: Past Medical History:  Diagnosis Date   Anosmia    Arm fracture    right arm   Ataxia    Colon polyp    ED (erectile dysfunction)    of organic origin   Elevated PSA    Hypertension    Hypothyroidism    Legally blind in left eye, as defined in [place]    Neuropathy    peripheral neuropathy   Osteoarthritis    Pneumonia 1960    Surgical History: Past Surgical History:  Procedure Laterality Date   APPENDECTOMY     CATARACT EXTRACTION  04/19/2010   COLONOSCOPY WITH PROPOFOL N/A 01/04/2017   Procedure: COLONOSCOPY WITH PROPOFOL;  Surgeon: Lollie Sails, MD;  Location: [facility] ENDOSCOPY;  Service: Endoscopy;  Laterality: N/A;   EYE SURGERY Left 2003   vitrectomy with TPA injection   HIP ARTHROPLASTY Right 03/06/2020   Procedure: ARTHROPLASTY BIPOLAR HIP (HEMIARTHROPLASTY);  Surgeon: Leim Fabry, MD;  Location: [facility] ORS;  Service: Orthopedics;  Laterality: Right;   HIP CLOSED REDUCTION Right 03/09/2020   Procedure: CLOSED REDUCTION HIP;  Surgeon: Hessie Knows, MD;   Location: [facility] ORS;  Service: Orthopedics;  Laterality: Right;   KNEE ARTHROSCOPY Bilateral 1973, 2000, 2002   1973 left, 2000 right, 2002 right   left hip pin Left 1999   LUMBAR LAMINECTOMY     rectal cauterization  1979   TONSILLECTOMY      Home Medications:  Allergies as of 03/26/2020   No Known Allergies     Medication List       Accurate as of March 26, 2020 10:12 AM. If you have any questions, ask your nurse or doctor.        aspirin EC 81 MG tablet Take 81 mg by mouth daily.   Cholecalciferol 50 MCG (2000 UT) Caps Take 2,000 Units by mouth daily.   Co Q-10 30 MG Caps Take 30 mg by mouth daily.   enoxaparin 40 MG/0.4ML injection Commonly known as: LOVENOX Inject 0.4 mLs (40 mg total) into the skin daily for 14 days.   fluticasone 50 MCG/ACT nasal spray Commonly known as: FLONASE Place 1 spray into both nostrils 2 (two) times daily as needed for allergies or rhinitis.   levothyroxine 75 MCG tablet Commonly known as: SYNTHROID Take 75 mcg by mouth daily before breakfast. Daily on an empty stomach with a glass of water at least 30 to 60 minutes before meal   lisinopril 10  MG tablet Commonly known as: ZESTRIL Take 10 mg by mouth daily.   multivitamin tablet Take 1 tablet by mouth daily.   OMEGA 3-6-9 COMPLEX PO Take 1 capsule by mouth daily.   ondansetron 4 MG tablet Commonly known as: ZOFRAN Take 1 tablet (4 mg total) by mouth every 6 (six) hours as needed for nausea.   oxyCODONE 5 MG immediate release tablet Commonly known as: Oxy IR/ROXICODONE Take 1 tablet (5 mg total) by mouth every 6 (six) hours as needed for severe pain (pain score 7-10).   PRESERVISION AREDS 2 PO Take 1 tablet by mouth in the morning and at bedtime.   sildenafil 20 MG tablet Commonly known as: REVATIO Take 20-40 mg by mouth daily as needed (erectile dysfunction).   sulfamethoxazole-trimethoprim 400-80 MG tablet Commonly known as: BACTRIM Take 1 tablet by mouth 2  (two) times daily.   traMADol 50 MG tablet Commonly known as: ULTRAM Take 1 tablet (50 mg total) by mouth every 6 (six) hours as needed for moderate pain.       Allergies: No Known Allergies  Family History: No family history on file.  Social History:  reports that he quit smoking about 62 years ago. His smoking use included cigarettes. He smoked 0.50 packs per day. He has never used smokeless tobacco. He reports that he does not drink alcohol and does not use drugs.   Physical Exam: BP 122/72 (BP Location: Left Arm, Patient Position: Sitting, Cuff Size: Normal)    Pulse 83    Ht 6\' 4"  (1.93 m)    Wt 152 lb (68.9 kg)    BMI 18.50 kg/m   Constitutional:  Alert and oriented, No acute distress. HEENT: [place] AT, moist mucus membranes.  Trachea midline, no masses. Cardiovascular: No clubbing, cyanosis, or edema. Respiratory: Normal respiratory effort, no increased work of breathing. GI: Abdomen is soft, nontender, nondistended, no abdominal masses GU: No CVA tenderness Skin: No rashes, bruises or suspicious lesions. Neurologic: Grossly intact, no focal deficits, moving all 4 extremities. Psychiatric: Normal mood and affect.  Laboratory Data: Lab Results  Component Value Date   WBC 9.4 03/09/2020   HGB 11.0 (L) 03/09/2020   HCT 32.2 (L) 03/09/2020   MCV 91.0 03/09/2020   PLT 188 03/09/2020    Lab Results  Component Value Date   CREATININE 0.93 03/09/2020    No results found for: PSA  No results found for: TESTOSTERONE  No results found for: HGBA1C  Urinalysis    Component Value Date/Time   COLORURINE YELLOW 06/20/2017 0903   APPEARANCEUR Clear 01/23/2020 1042   LABSPEC 1.006 06/20/2017 0903   PHURINE 6.0 06/20/2017 0903   GLUCOSEU Negative 01/23/2020 1042   HGBUR NEGATIVE 06/20/2017 0903   BILIRUBINUR Negative 01/23/2020 [place] 06/20/2017 0903   PROTEINUR Negative 01/23/2020 1042   PROTEINUR NEGATIVE 06/20/2017 0903   NITRITE Negative  01/23/2020 1042   NITRITE NEGATIVE 06/20/2017 0903   LEUKOCYTESUR Negative 01/23/2020 1042    Lab Results  Component Value Date   LABMICR See below: 01/23/2020   WBCUA 0-5 01/23/2020   LABEPIT 0-10 01/23/2020   BACTERIA None seen 01/23/2020    Pertinent Imaging: N/a  No results found for this or any previous visit.  No results found for this or any previous visit.  No results found for this or any previous visit.  No results found for this or any previous visit.  No results found for this or any previous visit.  No results found  for this or any previous visit.  No results found for this or any previous visit.  No results found for this or any previous visit.   Assessment & Plan:    1. Nocturia His UA is clear and PVR normal. Disc nature r/b/a tamsulosin trial, anticholinergics, beta3, and desmopressin. Will give a trial of QOD Myrbetriq. Also encouraged the OSA test. - Urinalysis, Complete - Bladder Scan (Post Void Residual) in office  2. PSA - they mentioned his daughter wanted PSA checked. We discussed the PSA wont change our bladder management given UA and PVR are reassuring and he just had a PSA two months ago. They declined PSA testing.   They desire PRN f/u but if continue meds will see in 1 year.   No follow-ups on file.  Festus Aloe, MD  [facility] [address] [place], [place] [address] [phone]

## 2020-03-29 LAB — MICROSCOPIC EXAMINATION

## 2020-03-29 LAB — URINALYSIS, COMPLETE
Bilirubin, UA: NEGATIVE
Glucose, UA: NEGATIVE
Ketones, UA: NEGATIVE
Leukocytes,UA: NEGATIVE
Nitrite, UA: NEGATIVE
Protein,UA: NEGATIVE
RBC, UA: NEGATIVE
Specific Gravity, UA: 1.025 (ref 1.005–1.030)
Urobilinogen, Ur: 1 mg/dL (ref 0.2–1.0)
pH, UA: 6 (ref 5.0–7.5)

## 2020-04-06 ENCOUNTER — Encounter: Payer: Self-pay | Admitting: Orthopedic Surgery

## 2020-09-03 ENCOUNTER — Other Ambulatory Visit (HOSPITAL_BASED_OUTPATIENT_CLINIC_OR_DEPARTMENT_OTHER): Payer: Self-pay

## 2020-09-03 DIAGNOSIS — G4733 Obstructive sleep apnea (adult) (pediatric): Secondary | ICD-10-CM

## 2020-09-07 ENCOUNTER — Other Ambulatory Visit: Payer: Self-pay

## 2020-09-07 ENCOUNTER — Ambulatory Visit (HOSPITAL_BASED_OUTPATIENT_CLINIC_OR_DEPARTMENT_OTHER): Payer: Medicare HMO | Admitting: Internal Medicine

## 2020-12-29 ENCOUNTER — Other Ambulatory Visit: Payer: Self-pay

## 2020-12-29 ENCOUNTER — Ambulatory Visit (HOSPITAL_BASED_OUTPATIENT_CLINIC_OR_DEPARTMENT_OTHER): Payer: Medicare HMO | Attending: Internal Medicine | Admitting: Internal Medicine

## 2020-12-29 DIAGNOSIS — G4733 Obstructive sleep apnea (adult) (pediatric): Secondary | ICD-10-CM

## 2021-01-28 ENCOUNTER — Ambulatory Visit: Payer: Medicare HMO | Admitting: Urology

## 2021-02-02 ENCOUNTER — Ambulatory Visit: Payer: Medicare HMO | Admitting: Urology

## 2021-02-03 ENCOUNTER — Ambulatory Visit (INDEPENDENT_AMBULATORY_CARE_PROVIDER_SITE_OTHER): Payer: Medicare HMO | Admitting: Urology

## 2021-02-03 ENCOUNTER — Encounter: Payer: Self-pay | Admitting: Urology

## 2021-02-03 ENCOUNTER — Other Ambulatory Visit: Payer: Self-pay

## 2021-02-03 VITALS — BP 178/84 | HR 64 | Ht 76.0 in | Wt 160.0 lb

## 2021-02-03 DIAGNOSIS — R972 Elevated prostate specific antigen [PSA]: Secondary | ICD-10-CM

## 2021-02-03 DIAGNOSIS — N400 Enlarged prostate without lower urinary tract symptoms: Secondary | ICD-10-CM

## 2021-02-03 NOTE — Progress Notes (Signed)
02/03/2021 12:52 PM   Robert Knight [birth year] [id number]  Referring provider: Kirk Ruths, MD [place],  [place] [address]  Chief Complaint  Patient presents with   Elevated PSA    HPI: 86 y.o. male presents for follow-up with his wife.  Refer to Dr. Lyndal Rainbow prior note 03/26/2020 He did have a sleep study and was diagnosed with sleep apnea and is currently on CPAP He states he rarely gets up at night to void No bothersome LUTS No longer on Myrbetriq   PMH: Past Medical History:  Diagnosis Date   Anosmia    Arm fracture    right arm   Ataxia    Colon polyp    ED (erectile dysfunction)    of organic origin   Elevated PSA    Hypertension    Hypothyroidism    Legally blind in left eye, as defined in [place]    Neuropathy    peripheral neuropathy   Osteoarthritis    Pneumonia 1960    Surgical History: Past Surgical History:  Procedure Laterality Date   APPENDECTOMY     CATARACT EXTRACTION  04/19/2010   COLONOSCOPY WITH PROPOFOL N/A 01/04/2017   Procedure: COLONOSCOPY WITH PROPOFOL;  Surgeon: Lollie Sails, MD;  Location: [facility] ENDOSCOPY;  Service: Endoscopy;  Laterality: N/A;   EYE SURGERY Left 2003   vitrectomy with TPA injection   HIP ARTHROPLASTY Right 03/06/2020   Procedure: ARTHROPLASTY BIPOLAR HIP (HEMIARTHROPLASTY);  Surgeon: Leim Fabry, MD;  Location: [facility] ORS;  Service: Orthopedics;  Laterality: Right;   HIP CLOSED REDUCTION Right 03/09/2020   Procedure: CLOSED REDUCTION HIP;  Surgeon: Hessie Knows, MD;  Location: [facility] ORS;  Service: Orthopedics;  Laterality: Right;   KNEE ARTHROSCOPY Bilateral 1973, 2000, 2002   1973 left, 2000 right, 2002 right   left hip pin Left 1999   LUMBAR LAMINECTOMY     rectal cauterization  1979   TONSILLECTOMY      Home Medications:  Allergies as of 02/03/2021   No Known Allergies      Medication List        Accurate as of February 03, 2021 11:59 PM. If you  have any questions, ask your nurse or doctor.          STOP taking these medications    fluticasone 50 MCG/ACT nasal spray Commonly known as: FLONASE Stopped by: Abbie Sons, MD   mirabegron ER 25 MG Tb24 tablet Commonly known as: Myrbetriq Stopped by: Abbie Sons, MD   multivitamin tablet Stopped by: Abbie Sons, MD   PRESERVISION AREDS 2 PO Stopped by: Abbie Sons, MD   sulfamethoxazole-trimethoprim 400-80 MG tablet Commonly known as: BACTRIM Stopped by: Abbie Sons, MD       TAKE these medications    aspirin EC 81 MG tablet Take 81 mg by mouth daily.   Cholecalciferol 50 MCG (2000 UT) Caps Take 2,000 Units by mouth daily.   Co Q-10 30 MG Caps Take 30 mg by mouth daily.   levothyroxine 75 MCG tablet Commonly known as: SYNTHROID Take 75 mcg by mouth daily before breakfast. Daily on an empty stomach with a glass of water at least 30 to 60 minutes before meal   lisinopril 10 MG tablet Commonly known as: ZESTRIL Take 10 mg by mouth daily.   OMEGA 3-6-9 COMPLEX PO Take 1 capsule by mouth daily.        Allergies: No Known Allergies  Family History: History reviewed. No pertinent family history.  Social History:  reports that he quit smoking about 63 years ago. His smoking use included cigarettes. He smoked an average of 0.50 packs per day. He has never used smokeless tobacco. He reports that he does not drink alcohol and does not use drugs.   Physical Exam: BP (!) 178/84   Pulse 64   Ht 6\' 4"  (1.93 m)   Wt 160 lb (72.6 kg)   BMI 19.48 kg/m   Constitutional:  Alert and, No acute distress. HEENT: [place] AT, moist mucus membranes.  Trachea midline, no masses. Cardiovascular: No clubbing, cyanosis, or edema. Respiratory: Normal respiratory effort, no increased work of breathing.   Assessment & Plan:    1.  BPH with LUTS No bothersome nocturia or lower urinary tract symptoms  2.  Elevated PSA PSA last year mildly elevated by  strict criteria and normal by age specific guidelines They requested repeat PSA and will be notified with results Follow-up 1 year    Abbie Sons, MD  [facility] [address] [place], [place] [address] [phone]

## 2021-02-04 LAB — PSA: Prostate Specific Ag, Serum: 5.3 ng/mL — ABNORMAL HIGH (ref 0.0–4.0)

## 2021-02-08 ENCOUNTER — Encounter: Payer: Self-pay | Admitting: *Deleted

## 2021-05-11 ENCOUNTER — Other Ambulatory Visit
Admission: RE | Admit: 2021-05-11 | Discharge: 2021-05-11 | Disposition: A | Discharge status: Home / Self Care | Payer: Medicare HMO | Source: Ambulatory Visit | Attending: Sports Medicine | Admitting: Sports Medicine

## 2021-05-11 DIAGNOSIS — M25521 Pain in right elbow: Secondary | ICD-10-CM | POA: Insufficient documentation

## 2021-05-11 DIAGNOSIS — M7021 Olecranon bursitis, right elbow: Secondary | ICD-10-CM | POA: Diagnosis present

## 2021-05-11 LAB — SYNOVIAL CELL COUNT + DIFF, W/ CRYSTALS
Crystals, Fluid: NONE SEEN
Eosinophils-Synovial: 0 %
Lymphocytes-Synovial Fld: 28 %
Monocyte-Macrophage-Synovial Fluid: 56 %
Neutrophil, Synovial: 16 %
WBC, Synovial: 293 /mm3 — ABNORMAL HIGH (ref 0–200)

## 2021-11-22 ENCOUNTER — Ambulatory Visit: Payer: Managed Care, Other (non HMO) | Admitting: Neurology

## 2022-02-03 ENCOUNTER — Ambulatory Visit: Payer: Self-pay | Admitting: Urology

## 2022-02-03 ENCOUNTER — Encounter: Payer: Self-pay | Admitting: Urology

## 2022-02-03 NOTE — Progress Notes (Incomplete)
02/03/22 10:31 AM   Robert Knight October 25, 1934 [id number]  Referring provider:  Kirk Ruths, MD [place] [facility] [place],  [place] [address] No chief complaint on file.   Urological history  1.  BPH with LUTS - IPSS   2.  Elevated PSA - PSA 5.3 on 02/03/2021  HPI: Robert Knight is a 87 y.o.male who presents today for a 1 year follow-up.       PMH: Past Medical History:  Diagnosis Date   Anosmia    Arm fracture    right arm   Ataxia    Colon polyp    ED (erectile dysfunction)    of organic origin   Elevated PSA    Hypertension    Hypothyroidism    Legally blind in left eye, as defined in [place]    Neuropathy    peripheral neuropathy   Osteoarthritis    Pneumonia 1960    Surgical History: Past Surgical History:  Procedure Laterality Date   APPENDECTOMY     CATARACT EXTRACTION  04/19/2010   COLONOSCOPY WITH PROPOFOL N/A 01/04/2017   Procedure: COLONOSCOPY WITH PROPOFOL;  Surgeon: Lollie Sails, MD;  Location: [facility] ENDOSCOPY;  Service: Endoscopy;  Laterality: N/A;   EYE SURGERY Left 2003   vitrectomy with TPA injection   HIP ARTHROPLASTY Right 03/06/2020   Procedure: ARTHROPLASTY BIPOLAR HIP (HEMIARTHROPLASTY);  Surgeon: Leim Fabry, MD;  Location: [facility] ORS;  Service: Orthopedics;  Laterality: Right;   HIP CLOSED REDUCTION Right 03/09/2020   Procedure: CLOSED REDUCTION HIP;  Surgeon: Hessie Knows, MD;  Location: [facility] ORS;  Service: Orthopedics;  Laterality: Right;   KNEE ARTHROSCOPY Bilateral 1973, 2000, 2002   1973 left, 2000 right, 2002 right   left hip pin Left 1999   LUMBAR LAMINECTOMY     rectal cauterization  1979   TONSILLECTOMY      Home Medications:  Allergies as of 02/03/2022   No Known Allergies      Medication List        Accurate as of February 03, 2022 10:31 AM. If you have any questions, ask your nurse or doctor.          aspirin EC 81 MG tablet Take 81 mg by mouth daily.   Cholecalciferol 50  MCG (2000 UT) Caps Take 2,000 Units by mouth daily.   Co Q-10 30 MG Caps Take 30 mg by mouth daily.   levothyroxine 75 MCG tablet Commonly known as: SYNTHROID Take 75 mcg by mouth daily before breakfast. Daily on an empty stomach with a glass of water at least 30 to 60 minutes before meal   lisinopril 10 MG tablet Commonly known as: ZESTRIL Take 10 mg by mouth daily.   OMEGA 3-6-9 COMPLEX PO Take 1 capsule by mouth daily.        Allergies:  No Known Allergies  Family History: No family history on file.  Social History:  reports that he quit smoking about 64 years ago. His smoking use included cigarettes. He smoked an average of .5 packs per day. He has never used smokeless tobacco. He reports that he does not drink alcohol and does not use drugs.   Physical Exam: There were no vitals taken for this visit.  Constitutional:  Alert and oriented, No acute distress. HEENT: [place] AT, moist mucus membranes.  Trachea midline, no masses. Cardiovascular: No clubbing, cyanosis, or edema. Respiratory: Normal respiratory effort, no increased work of breathing. GI: Abdomen is soft, nontender,  nondistended, no abdominal masses GU: No CVA tenderness Lymph: No cervical or inguinal lymphadenopathy. Skin: No rashes, bruises or suspicious lesions. Neurologic: Grossly intact, no focal deficits, moving all 4 extremities. Psychiatric: Normal mood and affect.  Laboratory Data:  Lab Results  Component Value Date   CREATININE 0.93 03/09/2020     No results found for: "HGBA1C"  Urinalysis   Pertinent Imaging:   Assessment & Plan:     No follow-ups on file.  [facility] [address] [place], [place] [address] [phone]  I,Kailey Littlejohn,acting as a scribe for [facility], PA-C.,have documented all relevant documentation on the behalf of SHANNON MCGOWAN, PA-C,as directed by  [facility], PA-C while in the presence of [place], PA-C.

## 2023-04-06 ENCOUNTER — Emergency Department (HOSPITAL_BASED_OUTPATIENT_CLINIC_OR_DEPARTMENT_OTHER)
Admission: EM | Admit: 2023-04-06 | Discharge: 2023-04-06 | Disposition: A | Discharge status: Home / Self Care | Payer: Medicare HMO | Source: Home / Self Care | Attending: Emergency Medicine | Admitting: Emergency Medicine

## 2023-04-06 ENCOUNTER — Other Ambulatory Visit: Payer: Self-pay

## 2023-04-06 ENCOUNTER — Encounter (HOSPITAL_BASED_OUTPATIENT_CLINIC_OR_DEPARTMENT_OTHER): Payer: Self-pay | Admitting: Emergency Medicine

## 2023-04-06 DIAGNOSIS — Z7982 Long term (current) use of aspirin: Secondary | ICD-10-CM | POA: Insufficient documentation

## 2023-04-06 DIAGNOSIS — L02212 Cutaneous abscess of back [any part, except buttock]: Secondary | ICD-10-CM | POA: Insufficient documentation

## 2023-04-06 DIAGNOSIS — L0291 Cutaneous abscess, unspecified: Secondary | ICD-10-CM

## 2023-04-06 DIAGNOSIS — G319 Degenerative disease of nervous system, unspecified: Secondary | ICD-10-CM

## 2023-04-06 HISTORY — DX: Degenerative disease of nervous system, unspecified: G31.9

## 2023-04-06 MED ORDER — DOXYCYCLINE HYCLATE 100 MG PO CAPS: 100.0000 mg | ORAL_CAPSULE | Freq: Two times a day (BID) | ORAL | 0 refills | Status: AC

## 2023-04-06 MED ORDER — LIDOCAINE-EPINEPHRINE (PF) 2 %-1:200000 IJ SOLN
10.0000 mL | Freq: Once | INTRAMUSCULAR | Status: AC
  Administered 2023-04-06: 10 mL via INTRADERMAL
  Filled 2023-04-06: qty 20

## 2023-04-06 NOTE — ED Triage Notes (Signed)
Pt c/o "something" on the R side of his back x1w. Raised red area.

## 2023-04-06 NOTE — Discharge Instructions (Signed)
Please follow-up with your family doctor in the office.  If you no longer have one there is a family doctor's office in this building.  Warm compresses at least 4 times a day.  Please return for rapid spreading redness or if you develop a fever.

## 2023-04-06 NOTE — ED Provider Notes (Signed)
[place] EMERGENCY DEPARTMENT AT [facility] Provider Note   CSN: [id number] Arrival date & time: 04/06/23  0915     History  Chief Complaint  Patient presents with   Abscess    Robert Knight is a 88 y.o. male.  88 yo M with a chief complaints of a lesion about his right side of his back.  Family thinks has been there for about a week.  They are not sure exactly what it is.  Think that it is may be getting worse.  Does not seem to really be bothering him at all.  Sometimes irritates him if he touches it.  No fevers or chills.   Abscess      Home Medications Prior to Admission medications   Medication Sig Start Date End Date Taking? Authorizing Provider  doxycycline (VIBRAMYCIN) 100 MG capsule Take 1 capsule (100 mg total) by mouth 2 (two) times daily. One po bid x 7 days 04/06/23  Yes Melene Plan, DO  aspirin EC 81 MG tablet Take 81 mg by mouth daily.    [provider]  Cholecalciferol 2000 units CAPS Take 2,000 Units by mouth daily.     [provider]  Coenzyme Q10 (CO Q-10) 30 MG CAPS Take 30 mg by mouth daily.     [provider]  levothyroxine (SYNTHROID, LEVOTHROID) 75 MCG tablet Take 75 mcg by mouth daily before breakfast. Daily on an empty stomach with a glass of water at least 30 to 60 minutes before meal    [provider]  lisinopril (PRINIVIL,ZESTRIL) 10 MG tablet Take 10 mg by mouth daily.    [provider]  Omega 3-6-9 Fatty Acids (OMEGA 3-6-9 COMPLEX PO) Take 1 capsule by mouth daily.     [provider]      Allergies    Patient has no known allergies.    Review of Systems   Review of Systems  Physical Exam Updated Vital Signs BP (!) 168/84 (BP Location: Right Arm)   Pulse 74   Temp 97.9 F (36.6 C) (Oral)   Resp 15   SpO2 96%  Physical Exam Vitals and nursing note reviewed.  Constitutional:      Appearance: He is well-developed.  HENT:     Head: Normocephalic and atraumatic.   Eyes:     Pupils: Pupils are equal, round, and reactive to light.  Neck:     Vascular: No JVD.  Cardiovascular:     Rate and Rhythm: Normal rate and regular rhythm.     Heart sounds: No murmur heard.    No friction rub. No gallop.  Pulmonary:     Effort: No respiratory distress.     Breath sounds: No wheezing.  Abdominal:     General: There is no distension.     Tenderness: There is no abdominal tenderness. There is no guarding or rebound.  Musculoskeletal:        General: Normal range of motion.     Cervical back: Normal range of motion and neck supple.     Comments: Area along the lateral aspect of the right latissimus dorsi erythematous blanching in the center, some mild induration and fluctuance.  Skin:    Coloration: Skin is not pale.     Findings: No rash.  Neurological:     Mental Status: He is alert and oriented to person, place, and time.  Psychiatric:        Behavior: Behavior normal.     ED Results /  Procedures / Treatments   Labs (all labs ordered are listed, but only abnormal results are displayed) Labs Reviewed - No data to display  EKG None  Radiology No results found.  Procedures .Marland KitchenIncision and Drainage  Date/Time: 04/06/2023 9:57 AM  Performed by: Melene Plan, DO Authorized by: Melene Plan, DO   Consent:    Consent obtained:  Verbal   Consent given by:  Patient   Risks, benefits, and alternatives were discussed: yes     Risks discussed:  Bleeding, incomplete drainage and infection   Alternatives discussed:  No treatment, delayed treatment and alternative treatment Universal protocol:    Procedure explained and questions answered to patient or proxy's satisfaction: yes     Patient identity confirmed:  Verbally with patient Location:    Type:  Abscess   Location:  Trunk   Trunk location:  Back Pre-procedure details:    Skin preparation:  Chlorhexidine Sedation:    Sedation type:  None Anesthesia:    Anesthesia method:  Local infiltration    Local anesthetic:  Lidocaine 2% WITH epi Procedure type:    Complexity:  Complex Procedure details:    Incision types:  Single straight   Incision depth:  Subcutaneous   Wound management:  Probed and deloculated   Drainage:  Purulent   Drainage amount:  Scant   Wound treatment:  Wound left open   Packing materials:  None Post-procedure details:    Procedure completion:  Tolerated well, no immediate complications Comments:     Caseous material removed.     Medications Ordered in ED Medications  lidocaine-EPINEPHrine (XYLOCAINE W/EPI) 2 %-1:200000 (PF) injection 10 mL (10 mLs Intradermal Given by Other 04/06/23 6387)    ED Course/ Medical Decision Making/ A&P                                 Medical Decision Making Risk Prescription drug management.   88 yo M with a chief complaints of a lesion to the right side of his upper back.  Initially the family said is been there for a long time and then later said maybe it has been there for a week.  Most consistent with an abscess.  Will perform an I&D at bedside.  I&D with mostly glandular material that there was some purulence.  Will start on oral antibiotics warm compresses PCP follow-up.  9:58 AM:  I have discussed the diagnosis/risks/treatment options with the patient.  Evaluation and diagnostic testing in the emergency department does not suggest an emergent condition requiring admission or immediate intervention beyond what has been performed at this time.  They will follow up with PCP. We also discussed returning to the ED immediately if new or worsening sx occur. We discussed the sx which are most concerning (e.g., sudden worsening pain, fever, inability to tolerate by mouth) that necessitate immediate return. Medications administered to the patient during their visit and any new prescriptions provided to the patient are listed below.  Medications given during this visit Medications  lidocaine-EPINEPHrine (XYLOCAINE W/EPI) 2  %-1:200000 (PF) injection 10 mL (10 mLs Intradermal Given by Other 04/06/23 5643)     The patient appears reasonably screen and/or stabilized for discharge and I doubt any other medical condition or other [facility] requiring further screening, evaluation, or treatment in the ED at this time prior to discharge.          Final Clinical Impression(s) / ED Diagnoses Final diagnoses:  Abscess    Rx / DC Orders ED Discharge Orders          Ordered    doxycycline (VIBRAMYCIN) 100 MG capsule  2 times daily        04/06/23 0956              Melene Plan, DO 04/06/23 [phone]

## 2023-04-06 NOTE — ED Notes (Signed)
Non-adherent dressing applied to incised and drained area.

## 2023-04-13 ENCOUNTER — Emergency Department (HOSPITAL_BASED_OUTPATIENT_CLINIC_OR_DEPARTMENT_OTHER): Payer: Medicare HMO

## 2023-04-13 ENCOUNTER — Encounter (HOSPITAL_BASED_OUTPATIENT_CLINIC_OR_DEPARTMENT_OTHER): Payer: Self-pay | Admitting: Emergency Medicine

## 2023-04-13 ENCOUNTER — Emergency Department (HOSPITAL_BASED_OUTPATIENT_CLINIC_OR_DEPARTMENT_OTHER)
Admission: EM | Admit: 2023-04-13 | Discharge: 2023-04-13 | Disposition: A | Discharge status: Home / Self Care | Payer: Medicare HMO | Attending: Emergency Medicine | Admitting: Emergency Medicine

## 2023-04-13 DIAGNOSIS — F0390 Unspecified dementia without behavioral disturbance: Secondary | ICD-10-CM | POA: Diagnosis not present

## 2023-04-13 DIAGNOSIS — R4182 Altered mental status, unspecified: Secondary | ICD-10-CM

## 2023-04-13 DIAGNOSIS — Z20822 Contact with and (suspected) exposure to covid-19: Secondary | ICD-10-CM | POA: Insufficient documentation

## 2023-04-13 DIAGNOSIS — Z7982 Long term (current) use of aspirin: Secondary | ICD-10-CM | POA: Diagnosis not present

## 2023-04-13 DIAGNOSIS — R509 Fever, unspecified: Secondary | ICD-10-CM

## 2023-04-13 LAB — COMPREHENSIVE METABOLIC PANEL
ALT: 10 U/L (ref 0–44)
AST: 15 U/L (ref 15–41)
Albumin: 4.2 g/dL (ref 3.5–5.0)
Alkaline Phosphatase: 74 U/L (ref 38–126)
Anion gap: 9 (ref 5–15)
BUN: 22 mg/dL (ref 8–23)
CO2: 28 mmol/L (ref 22–32)
Calcium: 9.2 mg/dL (ref 8.9–10.3)
Chloride: 103 mmol/L (ref 98–111)
Creatinine, Ser: 0.91 mg/dL (ref 0.61–1.24)
GFR, Estimated: >60 mL/min (ref >60)
Glucose, Bld: 118 mg/dL — ABNORMAL HIGH (ref 70–99)
Potassium: 4.4 mmol/L (ref 3.5–5.1)
Sodium: 140 mmol/L (ref 135–145)
Total Bilirubin: 0.8 mg/dL (ref 0.3–1.2)
Total Protein: 6.7 g/dL (ref 6.5–8.1)

## 2023-04-13 LAB — CBC WITH DIFFERENTIAL/PLATELET
Abs Immature Granulocytes: 0.03 10*3/uL (ref 0.00–0.07)
Basophils Absolute: 0 10*3/uL (ref 0.0–0.1)
Basophils Relative: 0 %
Eosinophils Absolute: 0.1 10*3/uL (ref 0.0–0.5)
Eosinophils Relative: 1 %
HCT: 40.4 % (ref 39.0–52.0)
Hemoglobin: 13.5 g/dL (ref 13.0–17.0)
Immature Granulocytes: 0 %
Lymphocytes Relative: 5 %
Lymphs Abs: 0.4 10*3/uL — ABNORMAL LOW (ref 0.7–4.0)
MCH: 31 pg (ref 26.0–34.0)
MCHC: 33.4 g/dL (ref 30.0–36.0)
MCV: 92.9 fL (ref 80.0–100.0)
Monocytes Absolute: 0.7 10*3/uL (ref 0.1–1.0)
Monocytes Relative: 8 %
Neutro Abs: 7.3 10*3/uL (ref 1.7–7.7)
Neutrophils Relative %: 86 %
Platelets: 165 10*3/uL (ref 150–400)
RBC: 4.35 MIL/uL (ref 4.22–5.81)
RDW: 13.5 % (ref 11.5–15.5)
WBC: 8.5 10*3/uL (ref 4.0–10.5)
nRBC: 0 % (ref 0.0–0.2)

## 2023-04-13 LAB — RESP PANEL BY RT-PCR (RSV, FLU A&B, COVID)  RVPGX2
Influenza A by PCR: NEGATIVE
Influenza B by PCR: NEGATIVE
Resp Syncytial Virus by PCR: NEGATIVE
SARS Coronavirus 2 by RT PCR: NEGATIVE

## 2023-04-13 LAB — LACTIC ACID, PLASMA: Lactic Acid, Venous: 1.1 mmol/L (ref 0.5–1.9)

## 2023-04-13 MED ORDER — CEPHALEXIN 500 MG PO CAPS: 500.0000 mg | ORAL_CAPSULE | Freq: Two times a day (BID) | ORAL | 0 refills | Status: AC

## 2023-04-13 MED ORDER — SODIUM CHLORIDE 0.9 % IV SOLN
2.0000 g | Freq: Once | INTRAVENOUS | Status: AC
  Administered 2023-04-13: 2 g via INTRAVENOUS
  Filled 2023-04-13: qty 20

## 2023-04-13 NOTE — ED Provider Notes (Signed)
[place] EMERGENCY DEPARTMENT AT [facility] Provider Note   CSN: [id number] Arrival date & time: 04/13/23  1956     History  Chief Complaint  Patient presents with   Altered Mental Status   Fever    Robert Knight is a 88 y.o. male.  Patient here with fever, altered mental status.  History of dementia.  Family states that he is confused today but he is actually gotten a lot better after some Advil and fever now resolved.  He has not complained of any cough or sputum production or abdominal pain or pain with urination.  No falls.  Denied his head.  They are not concern for any head injury.  He is DNR.  They are having hospice come out to the house this weekend.  They do not want to be hospitalized.  He had abscess drained on his back last week that looks better.  He finished antibiotics.  They would like blood work to see if they can find any source for infection.  The history is provided by the patient and a caregiver.       Home Medications Prior to Admission medications   Medication Sig Start Date End Date Taking? Authorizing Provider  cephALEXin (KEFLEX) 500 MG capsule Take 1 capsule (500 mg total) by mouth 2 (two) times daily for 5 days. 04/13/23 04/18/23 Yes Aiya Keach, DO  aspirin EC 81 MG tablet Take 81 mg by mouth daily.    [provider]  Cholecalciferol 2000 units CAPS Take 2,000 Units by mouth daily.     [provider]  Coenzyme Q10 (CO Q-10) 30 MG CAPS Take 30 mg by mouth daily.     [provider]  doxycycline (VIBRAMYCIN) 100 MG capsule Take 1 capsule (100 mg total) by mouth 2 (two) times daily. One po bid x 7 days 04/06/23   Melene Plan, DO  levothyroxine (SYNTHROID, LEVOTHROID) 75 MCG tablet Take 75 mcg by mouth daily before breakfast. Daily on an empty stomach with a glass of water at least 30 to 60 minutes before meal    [provider]  lisinopril (PRINIVIL,ZESTRIL) 10 MG tablet Take 10 mg by mouth daily.     [provider]  Omega 3-6-9 Fatty Acids (OMEGA 3-6-9 COMPLEX PO) Take 1 capsule by mouth daily.     [provider]      Allergies    Patient has no known allergies.    Review of Systems   Review of Systems  Physical Exam Updated Vital Signs BP (!) 144/88   Pulse 72   Temp 99 F (37.2 C)   Resp 20   SpO2 99%  Physical Exam Vitals and nursing note reviewed.  Constitutional:      General: He is not in acute distress.    Appearance: He is well-developed. He is not ill-appearing.  HENT:     Head: Normocephalic and atraumatic.     Mouth/Throat:     Mouth: Mucous membranes are moist.  Eyes:     Extraocular Movements: Extraocular movements intact.     Conjunctiva/sclera: Conjunctivae normal.     Pupils: Pupils are equal, round, and reactive to light.  Cardiovascular:     Rate and Rhythm: Normal rate and regular rhythm.     Pulses: Normal pulses.     Heart sounds: Normal heart sounds. No murmur heard. Pulmonary:     Effort: Pulmonary effort is normal. No respiratory distress.     Breath sounds: Normal breath  sounds.  Abdominal:     Palpations: Abdomen is soft.     Tenderness: There is no abdominal tenderness.  Musculoskeletal:        General: No swelling.     Cervical back: Normal range of motion and neck supple.  Skin:    General: Skin is warm and dry.     Capillary Refill: Capillary refill takes less than 2 seconds.     Comments: Well-healing abscess-like scar on his back no signs of cellulitis  Neurological:     Mental Status: He is alert. Mental status is at baseline.  Psychiatric:        Mood and Affect: Mood normal.     ED Results / Procedures / Treatments   Labs (all labs ordered are listed, but only abnormal results are displayed) Labs Reviewed  COMPREHENSIVE METABOLIC PANEL - Abnormal; Notable for the following components:      Result Value   Glucose, Bld 118 (*)    All other components within normal limits  CBC WITH  DIFFERENTIAL/PLATELET - Abnormal; Notable for the following components:   Lymphs Abs 0.4 (*)    All other components within normal limits  CULTURE, BLOOD (ROUTINE X 2)  CULTURE, BLOOD (ROUTINE X 2)  RESP PANEL BY RT-PCR (RSV, FLU A&B, COVID)  [id number]  LACTIC ACID, PLASMA  URINALYSIS, W/ REFLEX TO CULTURE (INFECTION SUSPECTED)    EKG EKG Interpretation Date/Time:  Friday April 13 2023 21:44:27 EDT Ventricular Rate:  72 PR Interval:  227 QRS Duration:  141 QT Interval:  401 QTC Calculation: 439 R Axis:   85  Text Interpretation: Sinus rhythm Prolonged PR interval Confirmed by Virgina Norfolk [phone]) on 04/13/2023 10:37:00 PM  Radiology DG Chest Port 1 View  Result Date: 04/13/2023 CLINICAL DATA:  Sepsis, fever EXAM: PORTABLE CHEST 1 VIEW COMPARISON:  03/09/2020 FINDINGS: The lungs are mildly hyperinflated in keeping with changes of underlying COPD. Biapical pleuroparenchymal scarring is unchanged. No confluent pulmonary infiltrate. No pneumothorax or pleural effusion. Cardiac size within normal limits. No acute bone abnormality. IMPRESSION: 1. COPD. No active disease. Electronically Signed   By: Helyn Numbers M.D.   On: 04/13/2023 22:30    Procedures Procedures    Medications Ordered in ED Medications  cefTRIAXone (ROCEPHIN) 2 g in sodium chloride 0.9 % 100 mL IVPB (0 g Intravenous Stopped 04/13/23 2252)    ED Course/ Medical Decision Making/ A&P                                 Medical Decision Making Amount and/or Complexity of Data Reviewed Labs: ordered. Radiology: ordered. ECG/medicine tests: ordered.  Risk Prescription drug management.   Robert Knight is here with fever and confusion.  Unremarkable vitals.  No fever here.  Family did not want rectal temperature.  They are trying to transition him to hospice.  He has a history of dementia.  He is DNR.  They do not want to be hospitalized but they do want to see if he has any obvious source for infection.  He had a  fever of 103.5 prior to arrival but he was given Aleve.  He seemed much better now that the fevers improved.  Infectious workup was initiated but his vital signs do not meet sepsis criteria.  He is not having any obvious infectious symptoms.  He had a abscess drained on his back recently that looks very well.  Blood cultures, CBC lactic  acid chest x-ray COVID and flu testing were obtained.  Overall there is no significant leukocytosis or anemia or electrolyte abnormality.  Lactic acid was normal.  Chest x-ray showed no evidence of pneumonia per my review interpretation of labs and imaging.  We tried multiple times for urine.  Family did not want to do an In-N-Out catheter.  They ultimately did not want to wait any longer for him to get a urine sample.  I gave him a dose of Rocephin.  Overall we did some shared decision making.  We will give him some antibiotics to cover for possible UTI.  They will try to give a sample to hospice/primary care.  They will return if symptoms worsen but blood cultures have been drawn and overall we will call back if positive.  COVID and flu tests are pending.  They will follow that up on MyChart.  Patient discharged in good condition.  Well-appearing throughout my care.  I have very low suspicion for sepsis.  This chart was dictated using voice recognition software.  Despite best efforts to proofread,  errors can occur which can change the documentation meaning.         Final Clinical Impression(s) / ED Diagnoses Final diagnoses:  Altered mental status, unspecified altered mental status type  Fever, unspecified fever cause    Rx / DC Orders ED Discharge Orders          Ordered    cephALEXin (KEFLEX) 500 MG capsule  2 times daily        04/13/23 2259              Virgina Norfolk, DO 04/13/23 2302

## 2023-04-13 NOTE — ED Triage Notes (Signed)
Fever max of 103.5 today.  Aleve given 1 hour ago Fall today balance off Last known normal 1 week ago Seen last week for abscess, drain  DNR, does not want hospitalization, has hospice eval on sunday

## 2023-04-13 NOTE — Discharge Instructions (Signed)
Please return to this worsen as discussed.  I am empirically treating for urinary tract infection.  We will call you if blood cultures are positive.  Continue Tylenol and ibuprofen for pain and fever.

## 2023-04-13 NOTE — ED Notes (Addendum)
Family refused rectal temp

## 2023-04-19 LAB — CULTURE, BLOOD (ROUTINE X 2)
Culture: NO GROWTH
Culture: NO GROWTH
Special Requests: ADEQUATE
Special Requests: ADEQUATE

## 2023-05-01 ENCOUNTER — Ambulatory Visit (HOSPITAL_BASED_OUTPATIENT_CLINIC_OR_DEPARTMENT_OTHER): Payer: Medicare HMO | Admitting: Family Medicine

## 2024-03-04 NOTE — Procedures (Signed)
Mask fit

## 2024-03-04 NOTE — Procedures (Signed)
  The patient stated that he only needed proper instructions on how to apply the mask. The patient demonstrated the teachback method without difficulty.
